# Patient Record
Sex: Female | Born: 1955 | Race: White | Hispanic: No | Marital: Married | Smoking: Never smoker
Health system: Southern US, Community
[De-identification: ages and names within clinical notes are randomized; demographics above are authoritative.]

## PROBLEM LIST (undated history)

## (undated) DIAGNOSIS — E119 Type 2 diabetes mellitus without complications: Secondary | ICD-10-CM

## (undated) HISTORY — PX: BREAST CYST EXCISION: SHX579

## (undated) HISTORY — PX: ABDOMINAL HYSTERECTOMY: SHX81

---

## 2014-11-12 DIAGNOSIS — E119 Type 2 diabetes mellitus without complications: Secondary | ICD-10-CM

## 2015-03-10 DIAGNOSIS — E782 Mixed hyperlipidemia: Secondary | ICD-10-CM | POA: Diagnosis present

## 2015-09-28 ENCOUNTER — Other Ambulatory Visit: Payer: Self-pay | Admitting: Family Medicine

## 2015-09-28 DIAGNOSIS — Z1231 Encounter for screening mammogram for malignant neoplasm of breast: Secondary | ICD-10-CM

## 2017-02-25 ENCOUNTER — Other Ambulatory Visit: Payer: Self-pay | Admitting: Family Medicine

## 2017-02-25 DIAGNOSIS — Z1239 Encounter for other screening for malignant neoplasm of breast: Secondary | ICD-10-CM

## 2017-03-06 ENCOUNTER — Ambulatory Visit
Admission: RE | Admit: 2017-03-06 | Discharge: 2017-03-06 | Disposition: A | Discharge status: Home / Self Care | Payer: BC Managed Care – PPO | Source: Ambulatory Visit | Attending: Family Medicine | Admitting: Family Medicine

## 2017-03-06 ENCOUNTER — Encounter: Payer: Self-pay | Admitting: Radiology

## 2017-03-06 DIAGNOSIS — Z1239 Encounter for other screening for malignant neoplasm of breast: Secondary | ICD-10-CM

## 2017-03-06 DIAGNOSIS — Z1231 Encounter for screening mammogram for malignant neoplasm of breast: Secondary | ICD-10-CM | POA: Insufficient documentation

## 2017-03-14 ENCOUNTER — Inpatient Hospital Stay
Admission: RE | Admit: 2017-03-14 | Discharge: 2017-03-14 | Disposition: A | Discharge status: Home / Self Care | Payer: Self-pay | Source: Ambulatory Visit | Attending: *Deleted | Admitting: *Deleted

## 2017-03-14 ENCOUNTER — Other Ambulatory Visit: Payer: Self-pay | Admitting: *Deleted

## 2017-03-14 DIAGNOSIS — Z9289 Personal history of other medical treatment: Secondary | ICD-10-CM

## 2018-03-05 ENCOUNTER — Other Ambulatory Visit: Payer: Self-pay

## 2018-03-05 ENCOUNTER — Encounter: Payer: Self-pay | Admitting: Emergency Medicine

## 2018-03-05 ENCOUNTER — Ambulatory Visit
Admission: EM | Admit: 2018-03-05 | Discharge: 2018-03-05 | Disposition: A | Discharge status: Home / Self Care | Payer: BC Managed Care – PPO | Attending: Family Medicine | Admitting: Family Medicine

## 2018-03-05 DIAGNOSIS — M5442 Lumbago with sciatica, left side: Secondary | ICD-10-CM | POA: Diagnosis not present

## 2018-03-05 DIAGNOSIS — T148XXA Other injury of unspecified body region, initial encounter: Secondary | ICD-10-CM

## 2018-03-05 DIAGNOSIS — S39012A Strain of muscle, fascia and tendon of lower back, initial encounter: Secondary | ICD-10-CM

## 2018-03-05 HISTORY — DX: Type 2 diabetes mellitus without complications: E11.9

## 2018-03-05 MED ORDER — MELOXICAM 7.5 MG PO TABS: 7.5000 mg | ORAL_TABLET | Freq: Every day | ORAL | 0 refills | Status: DC

## 2018-03-05 MED ORDER — CYCLOBENZAPRINE HCL 5 MG PO TABS: 5.0000 mg | ORAL_TABLET | Freq: Two times a day (BID) | ORAL | 0 refills | Status: DC | PRN

## 2018-03-05 NOTE — ED Triage Notes (Signed)
Pt here today c/o back pain. Started 4 days ago. She has been walking/exercising and had some back pain and radiated into her left buttock and down the left side of her leg. Took ibuprofen and was better. Was walking and doing things again. Then this morning she woke up and was worse. She has been doing stretches.

## 2018-03-05 NOTE — ED Provider Notes (Signed)
[facility] ____________________________________________  Time seen: Approximately 11:22 AM  I have reviewed the triage vital signs and the nursing notes.   HISTORY  Chief Complaint Back Pain   HPI Nancy Guerrero is a 62 y.o. female presenting with husband for evaluation of left lower back pain present for the last 1 week.  States initially she had a very mild twinge to her left lower back and she thought it was from walking more on different surfaces.  Worked outside pulling weeds and cutting weeds.  states then she had some pain to left lower back present after sleeping Friday night.  States Saturday her back was more sore in the left buttocks area causing her difficulty with sitting, bending and stepping down hard.  Patient reports the very mild twinge did improve and then she states that she does not like taking any medicine so she took very small doses dose of 1 or possible 2 doses of Advil intermittently which did help.  States on Monday and Tuesday she was feeling much better, and states yesterday she pretty proceeded to stretch her back more and states she feels like maybe now she overdid it.  States today she is having pain again with sitting as well as also having some pain in her left upper leg.  States pain is mostly with movement. Ice helps some.  States that she is sitting in a warm bath she has no pain.  Denies any numbness, loss of sensation, urinary bowel retention or incontinence, fevers, rash, fall or direct injury.  Reports that she does have known degenerative disc disease, denies any other past back problems.  Denies other aggravating or alleviating factors.  Reports otherwise feels well.  Denies chest pain, shortness of breath, abdominal pain, dysuria, extremity pain, extremity swelling or rash. Denies recent sickness. Denies recent antibiotic use.   Jaci Lazierrowder, Pete PeltJonathan Earl, MD: PCP   Past Medical History:  Diagnosis Date  . Diabetes mellitus without  complication (HCC)     There are no active problems to display for this patient.   Past Surgical History:  Procedure Laterality Date  . ABDOMINAL HYSTERECTOMY    . BREAST CYST EXCISION Right    neg     No current facility-administered medications for this encounter.   Current Outpatient Medications:  .  Cholecalciferol (VITAMIN D3) 400 units CAPS, Take 2 tablets by mouth 2 (two) times daily., Disp: , Rfl:  .  ibuprofen (ADVIL,MOTRIN) 200 MG tablet, Take by mouth., Disp: , Rfl:  .  magnesium oxide (MAG-OX) 400 MG tablet, Take by mouth., Disp: , Rfl:  .  vitamin B-12 (CYANOCOBALAMIN) 500 MCG tablet, Take by mouth., Disp: , Rfl:  .  cyclobenzaprine (FLEXERIL) 5 MG tablet, Take 1 tablet (5 mg total) by mouth 2 (two) times daily as needed for muscle spasms (do not drive, can cause drowsiness.)., Disp: 10 tablet, Rfl: 0 .  meloxicam (MOBIC) 7.5 MG tablet, Take 1 tablet (7.5 mg total) by mouth daily., Disp: 10 tablet, Rfl: 0  Allergies Latex; Azithromycin; Cefprozil; Clarithromycin; Iodinated diagnostic agents; Levofloxacin; Nitrofurantoin; and Penicillins  Family History  Problem Relation Age of Onset  . Heart failure Mother   . Rheum arthritis Father   . Breast cancer Neg Hx     Social History Social History   Tobacco Use  . Smoking status: Never Smoker  . Smokeless tobacco: Never Used  Substance Use Topics  . Alcohol use: Never    Frequency: Never  . Drug use: Never  Review of Systems Constitutional: No fever/chills. Cardiovascular: Denies chest pain. Respiratory: Denies shortness of breath. Gastrointestinal: No abdominal pain.  No nausea, no vomiting.  No diarrhea.  No constipation. Genitourinary: Negative for dysuria. Musculoskeletal: Negative for back pain. Skin: Negative for rash.   ____________________________________________   PHYSICAL EXAM:  VITAL SIGNS: ED Triage Vitals  Enc Vitals Group     BP 03/05/18 1007 (!) 152/83     Pulse Rate 03/05/18  1007 86     Resp 03/05/18 1007 18     Temp 03/05/18 1007 98.7 F (37.1 C)     Temp Source 03/05/18 1007 Oral     SpO2 03/05/18 1007 99 %     Weight 03/05/18 1007 164 lb (74.4 kg)     Height 03/05/18 1007 5' 6.5" (1.689 m)     Head Circumference --      Peak Flow --      Pain Score 03/05/18 1006 9     Pain Loc --      Pain Edu? --      Excl. in GC? --     Constitutional: Alert and oriented. Well appearing and in no acute distress. ENT      Head: Normocephalic and atraumatic. Cardiovascular: Normal rate, regular rhythm. Grossly normal heart sounds.  Good peripheral circulation. Respiratory: Normal respiratory effort without tachypnea nor retractions. Breath sounds are clear and equal bilaterally. No wheezes, rales, rhonchi. Gastrointestinal: Soft and nontender.  No CVA tenderness. Musculoskeletal: No midline cervical, thoracic or lumbar tenderness to palpation. Bilateral pedal pulses equal and easily palpated. Left lower sciatic notch and piriformis mild tenderness to direct palpation, mild tenderness along left IT band, no saddle anesthesia, left lower extremity otherwise nontender, no rash.  Mild pain with lumbar flexion, no pain with extension, mild pain with lumbar right and left rotation.  Mild pain with standing left knee left, no pain with right knee left.  No pain with plantarflexion or dorsiflexion.  Steady gait. Neurologic:  Normal speech and language. Speech is normal. No gait instability.  Skin:  Skin is warm, dry and intact. No rash noted. Psychiatric: Mood and affect are normal. Speech and behavior are normal. Patient exhibits appropriate insight and judgment   ___________________________________________   LABS (all labs ordered are listed, but only abnormal results are displayed)  Labs Reviewed - No data to display   PROCEDURES Procedures    INITIAL IMPRESSION / ASSESSMENT AND PLAN / ED COURSE  Pertinent labs & imaging results that were available during my care  of the patient were reviewed by me and considered in my medical decision making (see chart for details).  Well-appearing patient.  No acute distress.  No focal neurological deficit.  Suspect left-sided Low back strain with sciatica and muscle strain.  Encourage rest, ice, stretching, supportive care.  Patient states repetitively that she is very sensitive to medications.  Will treat with 7.5 mg daily Mobic.  As needed 5 mg Flexeril, in which patient states that she may try half a tablet first.  Discussed supportive care and avoidance of strenuous activity.Discussed indication, risks and benefits of medications with patient.   Discussed follow up with Primary care physician this week. Discussed follow up and return parameters including no resolution or any worsening concerns. Patient verbalized understanding and agreed to plan.   ____________________________________________   FINAL CLINICAL IMPRESSION(S) / ED DIAGNOSES  Final diagnoses:  Acute left-sided low back pain with left-sided sciatica  Muscle strain     ED Discharge Orders  Ordered    meloxicam (MOBIC) 7.5 MG tablet  Daily     03/05/18 1110    cyclobenzaprine (FLEXERIL) 5 MG tablet  2 times daily PRN     03/05/18 1110       Note: This dictation was prepared with Dragon dictation along with smaller phrase technology. Any transcriptional errors that result from this process are unintentional.         Renford Dills, NP 03/05/18 1129

## 2018-03-05 NOTE — Discharge Instructions (Signed)
Take medication as prescribed. Rest. Drink plenty of fluids.  ° °Follow up with your primary care physician this week as needed. Return to Urgent care for new or worsening concerns.  ° °

## 2018-08-14 ENCOUNTER — Other Ambulatory Visit: Payer: Self-pay | Admitting: Family Medicine

## 2018-08-14 DIAGNOSIS — Z1231 Encounter for screening mammogram for malignant neoplasm of breast: Secondary | ICD-10-CM

## 2018-09-01 ENCOUNTER — Ambulatory Visit
Admission: RE | Admit: 2018-09-01 | Discharge: 2018-09-01 | Disposition: A | Discharge status: Home / Self Care | Payer: BC Managed Care – PPO | Source: Ambulatory Visit | Attending: Family Medicine | Admitting: Family Medicine

## 2018-09-01 ENCOUNTER — Encounter (INDEPENDENT_AMBULATORY_CARE_PROVIDER_SITE_OTHER): Payer: Self-pay

## 2018-09-01 DIAGNOSIS — Z1231 Encounter for screening mammogram for malignant neoplasm of breast: Secondary | ICD-10-CM

## 2018-09-05 ENCOUNTER — Other Ambulatory Visit: Payer: Self-pay | Admitting: Family Medicine

## 2018-09-05 DIAGNOSIS — R921 Mammographic calcification found on diagnostic imaging of breast: Secondary | ICD-10-CM

## 2018-09-05 DIAGNOSIS — R928 Other abnormal and inconclusive findings on diagnostic imaging of breast: Secondary | ICD-10-CM

## 2018-09-10 ENCOUNTER — Ambulatory Visit
Admission: RE | Admit: 2018-09-10 | Discharge: 2018-09-10 | Disposition: A | Discharge status: Home / Self Care | Payer: BC Managed Care – PPO | Source: Ambulatory Visit | Attending: Family Medicine | Admitting: Family Medicine

## 2018-09-10 DIAGNOSIS — R928 Other abnormal and inconclusive findings on diagnostic imaging of breast: Secondary | ICD-10-CM | POA: Diagnosis present

## 2018-09-10 DIAGNOSIS — R921 Mammographic calcification found on diagnostic imaging of breast: Secondary | ICD-10-CM | POA: Diagnosis present

## 2020-05-05 ENCOUNTER — Other Ambulatory Visit: Payer: Self-pay

## 2020-05-05 ENCOUNTER — Ambulatory Visit
Admission: EM | Admit: 2020-05-05 | Discharge: 2020-05-05 | Disposition: A | Discharge status: Home / Self Care | Payer: Medicare PPO | Attending: Internal Medicine | Admitting: Internal Medicine

## 2020-05-05 ENCOUNTER — Encounter: Payer: Self-pay | Admitting: Internal Medicine

## 2020-05-05 DIAGNOSIS — R202 Paresthesia of skin: Secondary | ICD-10-CM | POA: Insufficient documentation

## 2020-05-05 DIAGNOSIS — T50Z95A Adverse effect of other vaccines and biological substances, initial encounter: Secondary | ICD-10-CM | POA: Diagnosis not present

## 2020-05-05 LAB — COMPREHENSIVE METABOLIC PANEL
ALT: 28 U/L (ref 0–44)
AST: 18 U/L (ref 15–41)
Albumin: 4.3 g/dL (ref 3.5–5.0)
Alkaline Phosphatase: 95 U/L (ref 38–126)
Anion gap: 10 (ref 5–15)
BUN: 24 mg/dL — ABNORMAL HIGH (ref 8–23)
CO2: 24 mmol/L (ref 22–32)
Calcium: 8.7 mg/dL — ABNORMAL LOW (ref 8.9–10.3)
Chloride: 103 mmol/L (ref 98–111)
Creatinine, Ser: 0.94 mg/dL (ref 0.44–1.00)
GFR calc Af Amer: >60 mL/min (ref >60)
GFR calc non Af Amer: >60 mL/min (ref >60)
Glucose, Bld: 132 mg/dL — ABNORMAL HIGH (ref 70–99)
Potassium: 3.8 mmol/L (ref 3.5–5.1)
Sodium: 137 mmol/L (ref 135–145)
Total Bilirubin: 0.6 mg/dL (ref 0.3–1.2)
Total Protein: 7.5 g/dL (ref 6.5–8.1)

## 2020-05-05 LAB — CBC WITH DIFFERENTIAL/PLATELET
Abs Immature Granulocytes: 0.03 10*3/uL (ref 0.00–0.07)
Basophils Absolute: 0 10*3/uL (ref 0.0–0.1)
Basophils Relative: 0 %
Eosinophils Absolute: 0.1 10*3/uL (ref 0.0–0.5)
Eosinophils Relative: 1 %
HCT: 46.6 % — ABNORMAL HIGH (ref 36.0–46.0)
Hemoglobin: 15.4 g/dL — ABNORMAL HIGH (ref 12.0–15.0)
Immature Granulocytes: 0 %
Lymphocytes Relative: 22 %
Lymphs Abs: 2.4 10*3/uL (ref 0.7–4.0)
MCH: 28.4 pg (ref 26.0–34.0)
MCHC: 33 g/dL (ref 30.0–36.0)
MCV: 86 fL (ref 80.0–100.0)
Monocytes Absolute: 0.6 10*3/uL (ref 0.1–1.0)
Monocytes Relative: 6 %
Neutro Abs: 7.4 10*3/uL (ref 1.7–7.7)
Neutrophils Relative %: 71 %
Platelets: 286 10*3/uL (ref 150–400)
RBC: 5.42 MIL/uL — ABNORMAL HIGH (ref 3.87–5.11)
RDW: 12 % (ref 11.5–15.5)
WBC: 10.5 10*3/uL (ref 4.0–10.5)
nRBC: 0 % (ref 0.0–0.2)

## 2020-05-05 LAB — MAGNESIUM: Magnesium: 2.2 mg/dL (ref 1.7–2.4)

## 2020-05-05 LAB — TSH: TSH: 0.823 u[IU]/mL (ref 0.350–4.500)

## 2020-05-05 LAB — FIBRIN DERIVATIVES D-DIMER (ARMC ONLY): Fibrin derivatives D-dimer (ARMC): 433.27 ng/mL (FEU) (ref 0.00–499.00)

## 2020-05-05 MED ORDER — HYDROXYZINE HCL 10 MG PO TABS: ORAL_TABLET | ORAL | 0 refills | Status: DC

## 2020-05-05 NOTE — Discharge Instructions (Addendum)
Your chemistry test shows slightly elevation of your sugar at 123, your calcium is slightly low, so consume more calcium, and you are a little dry. Drink more water.  The thyroid test, D-dimer( to check for clots) and B12 are not back yet. I will inform you when they are back.  I want you to try a medication called Atarax which we use of anxiety and allergic reactions/ itching. See how this helps you.  Your cell count shows slight elevation of your red cells, hemoglobin and hematocrit, this should be rechecked wit you doctor if it has been normal in the past.

## 2020-05-05 NOTE — ED Triage Notes (Signed)
Patient states she received her 1st COVID Pfizer vaccine on 09/01. She is having swollen lymph nodes in her neck, tingling and soreness in her body and elevated BP at night. She has been seen several times for these symptoms at [facility] and the ER. She contacted her PCP today and they advised her to come to UC.

## 2020-05-05 NOTE — ED Provider Notes (Signed)
[facility]    CSN: [id number] Arrival date & time: 05/05/20  1138      History   Chief Complaint Chief Complaint  Patient presents with  . immunization reaction    HPI Nancy Guerrero is a 64 y.o. female who presents with worse tingling all over her body  And twitching since Covid injection # 1 this month.  15 min right after she got the shot she felt tingling across her neck which has no resolved. She just felt swollen glands, and worse on the R.  She also  developed elevated blood pressure in the pm's and been to ER x 1, and x 1 at the urgent care. The urgent care provider told her she was just anxious, but pt denies being anxious at all. Has been reading her Bible and praying when she is awaken with the tingling or twitching.  She took Advil cold 2 days ago and helped her glands reduce by 50% Has been taking Vit C advised by her PCP.  The tingling and twitching gets better if she wears something tight on her body. Denies fatigue, and is exercising and drinking well.  She had similar symptoms when she had propophol reaction 15 years ago and took 3 weeks for this to resolve.  She denies CP and SOB.  Her glands are 50% better.  Has taken Benadryl 1/2 tsp qhs, which puts her to sleep til around 3 am, but cant tell it helps with the tingling or twitching. Only wakes up in the am with sore muscles.  She has had occasional chest pains across her chest which is provoked with palpation but has resolved and had normal EKG when she went to the ER. She has not had labs done when at the ER, and only had HGBA1C this month. BMP was in April, and no other labs besides lipids then.  She is on estrogen cream, but uses smaller doses than told twice a week. Has been on this x 2 months ago Takes centrum, vit D 2000 IU per day, magneisum 400 mg qd    Past Medical History:  Diagnosis Date  . Diabetes mellitus without complication (HCC)     There are no problems to display for this  patient.   Past Surgical History:  Procedure Laterality Date  . ABDOMINAL HYSTERECTOMY    . BREAST CYST EXCISION Right    neg    OB History   No obstetric history on file.      Home Medications    Prior to Admission medications   Medication Sig Start Date End Date Taking? Authorizing Provider  Cholecalciferol (VITAMIN D3) 400 units CAPS Take 2 tablets by mouth 2 (two) times daily.   Yes [provider]  ibuprofen (ADVIL,MOTRIN) 200 MG tablet Take by mouth.   Yes [provider]  magnesium oxide (MAG-OX) 400 MG tablet Take by mouth.   Yes [provider]  vitamin B-12 (CYANOCOBALAMIN) 500 MCG tablet Take by mouth.   Yes [provider]  hydrOXYzine (ATARAX/VISTARIL) 10 MG tablet 1-2 every 8 hours as needed for anxiety and itching. 05/05/20   Rodriguez-Southworth, Nettie Elm, PA-C    Family History Family History  Problem Relation Age of Onset  . Heart failure Mother   . Rheum arthritis Father   . Breast cancer Neg Hx     Social History Social History   Tobacco Use  . Smoking status: Never Smoker  . Smokeless tobacco: Never Used  Vaping Use  . Vaping  Use: Never used  Substance Use Topics  . Alcohol use: Never  . Drug use: Never     Allergies   Latex, Azithromycin, Cefprozil, Clarithromycin, Iodinated diagnostic agents, Levofloxacin, Nitrofurantoin, Propofol, and Penicillins   Review of Systems Review of Systems  Constitutional: Positive for appetite change. Negative for fever.       Has been making herself eat. Has been gaining weight  HENT: Negative for congestion.   Respiratory: Negative for cough, chest tightness and shortness of breath.   Cardiovascular: Negative for palpitations and leg swelling.       Has had intermittent chest soreness across her chest off and on and had nl EKG when she went to ER for this  Gastrointestinal: Positive for abdominal pain. Negative for constipation, diarrhea, nausea and vomiting.        Has generalized abdominal pain which is worse since after the Covid injection  Genitourinary: Negative for difficulty urinating.  Musculoskeletal: Negative for arthralgias, back pain, gait problem, joint swelling, myalgias, neck pain and neck stiffness.  Skin: Negative for rash.       Denies skin itching when exposed to cold water that she paid attention, but gets itching on her skin off and on  Neurological: Negative for dizziness, tremors, weakness, numbness and headaches.       + twitches   Psychiatric/Behavioral: Positive for sleep disturbance. Negative for agitation, behavioral problems and confusion. The patient is not nervous/anxious.      Physical Exam Triage Vital Signs ED Triage Vitals  Enc Vitals Group     BP 05/05/20 1215 (!) 155/88     Pulse Rate 05/05/20 1215 86     Resp 05/05/20 1215 18     Temp 05/05/20 1215 99.1 F (37.3 C)     Temp Source 05/05/20 1215 Oral     SpO2 05/05/20 1215 100 %     Weight 05/05/20 1212 156 lb (70.8 kg)     Height 05/05/20 1212 5\' 6"  (1.676 m)     Head Circumference --      Peak Flow --      Pain Score 05/05/20 1211 4     Pain Loc --      Pain Edu? --      Excl. in GC? --    No data found.  Updated Vital Signs BP (!) 155/88 (BP Location: Right Arm)   Pulse 86   Temp 99.1 F (37.3 C) (Oral)   Resp 18   Ht 5\' 6"  (1.676 m)   Wt 156 lb (70.8 kg)   SpO2 100%   BMI 25.18 kg/m   Visual Acuity Right Eye Distance:   Left Eye Distance:   Bilateral Distance:    Right Eye Near:   Left Eye Near:    Bilateral Near:     Physical Exam Physical Exam Vitals signs and nursing note reviewed.  Constitutional:      General: SHe is not in acute distress.    Appearance: He is well-developed and normal weight. SHe is not ill-appearing, toxic-appearing or diaphoretic.  HENT:     Head: Normocephalic.  Eyes:     Extraocular Movements: Extraocular movements intact.     Pupils: Pupils are equal, round, and reactive to light.  Neck:      Musculoskeletal: Neck supple. No neck rigidity.     Meningeal: Brudzinski's sign absent.  Cardiovascular:     Rate and Rhythm: Normal rate and regular rhythm.     Heart sounds: No murmur.  Pulmonary:  Effort: Pulmonary effort is normal.     Breath sounds: Normal breath sounds. No wheezing, rhonchi or rales.  Abdominal:     General: Bowel sounds are normal.     Palpations: Abdomen is soft. There is no mass.     Has generalized tenderness. There is no guarding.  Musculoskeletal: Normal range of motion. No soft tissue tenderness noted.  Lymphadenopathy:     Cervical: No cervical adenopathy.  Skin:    General: Skin is warm and dry.  Neurological:     Mental Status: She is alert.     Cranial Nerves: No cranial nerve deficit or facial asymmetry.     Sensory: No sensory deficit.     Motor: No weakness.     Coordination: Romberg sign negative. Coordination normal.     Gait: Gait normal.     Deep Tendon Reflexes: Reflexes are +3/4 bilaterally on upper and lower extremities.     Comments: Normal Romberg, finger to nose, tandem gait.  She did not have any twitching while I was in the room with her.  Psychiatric:        Mood and Affect: Mood normal.        Speech: Speech normal.        Behavior: Behavior normal.     UC Treatments / Results  Labs (all labs ordered are listed, but only abnormal results are displayed) Labs Reviewed  CBC WITH DIFFERENTIAL/PLATELET - Abnormal; Notable for the following components:      Result Value   RBC 5.42 (*)    Hemoglobin 15.4 (*)    HCT 46.6 (*)    All other components within normal limits  COMPREHENSIVE METABOLIC PANEL - Abnormal; Notable for the following components:   Glucose, Bld 132 (*)    BUN 24 (*)    Calcium 8.7 (*)    All other components within normal limits  FIBRIN DERIVATIVES D-DIMER ([facility] ONLY)  MAGNESIUM  VITAMIN B12  TSH    EKG   Radiology No results found.  Procedures Procedures (including critical care  time)  Medications Ordered in UC Medications - No data to display  Initial Impression / Assessment and Plan / UC Course  I have reviewed the triage vital signs and the nursing notes. I reviewed her CBC and CMP with her and since she could not find a CBC for me to compare it to, she was recommended to hydrate well and have it repeated in a few weeks.  Her D-dimer is normal.  I will have her try Atarax to see if this helps her tingling resolve, if she is anxious it may help, but if no change, then it is not anxiety.  Pertinent labs & imaging results that were available during my care of the patient were reviewed by me and considered in my medical decision making (see chart for details). I will inform her when the rest of the labs come in.  Clinical Course as of May 05 2024  Thu May 05, 2020  1337 RDW: 12.0 [SR]    Clinical Course User Index [SR] Rodriguez-Southworth, Nettie Elm, [place]    Final Clinical Impressions(s) / UC Diagnoses   Final diagnoses:  Adverse effect of vaccine, initial encounter     Discharge Instructions     Your chemistry test shows slightly elevation of your sugar at 123, your calcium is slightly low, so consume more calcium, and you are a little dry. Drink more water.  The thyroid test, D-dimer( to check for clots) and B12  are not back yet. I will inform you when they are back.  I want you to try a medication called Atarax which we use of anxiety and allergic reactions/ itching. See how this helps you.  Your cell count shows slight elevation of your red cells, hemoglobin and hematocrit, this should be rechecked wit you doctor if it has been normal in the past.     ED Prescriptions    Medication Sig Dispense Auth. Provider   hydrOXYzine (ATARAX/VISTARIL) 10 MG tablet 1-2 every 8 hours as needed for anxiety and itching. 30 tablet Rodriguez-Southworth, Nettie Elm, PA-C     PDMP not reviewed this encounter.   Garey Ham, Cordelia Poche 05/05/20 2026

## 2020-05-06 LAB — VITAMIN B12: Vitamin B-12: 299 pg/mL (ref 180–914)

## 2020-06-14 ENCOUNTER — Other Ambulatory Visit: Payer: Self-pay | Admitting: Cardiology

## 2020-06-14 ENCOUNTER — Other Ambulatory Visit: Payer: Self-pay

## 2020-06-14 DIAGNOSIS — R0602 Shortness of breath: Secondary | ICD-10-CM

## 2020-06-24 ENCOUNTER — Other Ambulatory Visit: Payer: Self-pay

## 2020-06-24 ENCOUNTER — Ambulatory Visit (INDEPENDENT_AMBULATORY_CARE_PROVIDER_SITE_OTHER)
Admission: RE | Admit: 2020-06-24 | Discharge: 2020-06-24 | Disposition: A | Discharge status: Home / Self Care | Payer: Self-pay | Source: Ambulatory Visit | Attending: Cardiology | Admitting: Cardiology

## 2020-06-24 DIAGNOSIS — R0602 Shortness of breath: Secondary | ICD-10-CM

## 2021-03-22 DIAGNOSIS — I10 Essential (primary) hypertension: Secondary | ICD-10-CM | POA: Diagnosis present

## 2021-03-24 ENCOUNTER — Emergency Department: Payer: Medicare PPO

## 2021-03-24 ENCOUNTER — Observation Stay: Payer: Medicare PPO

## 2021-03-24 ENCOUNTER — Other Ambulatory Visit: Payer: Self-pay

## 2021-03-24 ENCOUNTER — Observation Stay
Admission: EM | Admit: 2021-03-24 | Discharge: 2021-03-24 | Disposition: A | Discharge status: Home / Self Care | Payer: Medicare PPO | Attending: Internal Medicine | Admitting: Internal Medicine

## 2021-03-24 DIAGNOSIS — I10 Essential (primary) hypertension: Secondary | ICD-10-CM | POA: Insufficient documentation

## 2021-03-24 DIAGNOSIS — Z91040 Latex allergy status: Secondary | ICD-10-CM | POA: Diagnosis not present

## 2021-03-24 DIAGNOSIS — R079 Chest pain, unspecified: Principal | ICD-10-CM | POA: Insufficient documentation

## 2021-03-24 DIAGNOSIS — Z7984 Long term (current) use of oral hypoglycemic drugs: Secondary | ICD-10-CM | POA: Insufficient documentation

## 2021-03-24 DIAGNOSIS — E119 Type 2 diabetes mellitus without complications: Secondary | ICD-10-CM | POA: Diagnosis not present

## 2021-03-24 DIAGNOSIS — R778 Other specified abnormalities of plasma proteins: Secondary | ICD-10-CM | POA: Insufficient documentation

## 2021-03-24 DIAGNOSIS — E782 Mixed hyperlipidemia: Secondary | ICD-10-CM

## 2021-03-24 DIAGNOSIS — R000 Tachycardia, unspecified: Secondary | ICD-10-CM | POA: Diagnosis not present

## 2021-03-24 DIAGNOSIS — M79661 Pain in right lower leg: Secondary | ICD-10-CM | POA: Insufficient documentation

## 2021-03-24 DIAGNOSIS — R072 Precordial pain: Secondary | ICD-10-CM | POA: Diagnosis not present

## 2021-03-24 DIAGNOSIS — R0989 Other specified symptoms and signs involving the circulatory and respiratory systems: Secondary | ICD-10-CM

## 2021-03-24 DIAGNOSIS — Z7952 Long term (current) use of systemic steroids: Secondary | ICD-10-CM | POA: Insufficient documentation

## 2021-03-24 LAB — CBC
HCT: 47.8 % — ABNORMAL HIGH (ref 36.0–46.0)
Hemoglobin: 16.6 g/dL — ABNORMAL HIGH (ref 12.0–15.0)
MCH: 29.8 pg (ref 26.0–34.0)
MCHC: 34.7 g/dL (ref 30.0–36.0)
MCV: 85.8 fL (ref 80.0–100.0)
Platelets: 335 10*3/uL (ref 150–400)
RBC: 5.57 MIL/uL — ABNORMAL HIGH (ref 3.87–5.11)
RDW: 11.9 % (ref 11.5–15.5)
WBC: 16.1 10*3/uL — ABNORMAL HIGH (ref 4.0–10.5)
nRBC: 0 % (ref 0.0–0.2)

## 2021-03-24 LAB — PROTIME-INR
INR: 0.9 (ref 0.8–1.2)
Prothrombin Time: 12.5 seconds (ref 11.4–15.2)

## 2021-03-24 LAB — TROPONIN I (HIGH SENSITIVITY)
Troponin I (High Sensitivity): 29 ng/L — ABNORMAL HIGH (ref <18)
Troponin I (High Sensitivity): 29 ng/L — ABNORMAL HIGH (ref <18)

## 2021-03-24 LAB — BASIC METABOLIC PANEL
Anion gap: 13 (ref 5–15)
BUN: 37 mg/dL — ABNORMAL HIGH (ref 8–23)
CO2: 20 mmol/L — ABNORMAL LOW (ref 22–32)
Calcium: 9.4 mg/dL (ref 8.9–10.3)
Chloride: 101 mmol/L (ref 98–111)
Creatinine, Ser: 1.07 mg/dL — ABNORMAL HIGH (ref 0.44–1.00)
GFR, Estimated: 57 mL/min — ABNORMAL LOW (ref >60)
Glucose, Bld: 196 mg/dL — ABNORMAL HIGH (ref 70–99)
Potassium: 3.9 mmol/L (ref 3.5–5.1)
Sodium: 134 mmol/L — ABNORMAL LOW (ref 135–145)

## 2021-03-24 MED ORDER — AMLODIPINE BESYLATE 5 MG PO TABS: 5.0000 mg | ORAL_TABLET | Freq: Two times a day (BID) | ORAL | 0 refills | Status: DC | PRN

## 2021-03-24 MED ORDER — ONDANSETRON HCL 4 MG/2ML IJ SOLN
4.0000 mg | Freq: Once | INTRAMUSCULAR | Status: AC
  Administered 2021-03-24: 4 mg via INTRAVENOUS
  Filled 2021-03-24: qty 2

## 2021-03-24 MED ORDER — MORPHINE SULFATE (PF) 2 MG/ML IV SOLN
2.0000 mg | Freq: Once | INTRAVENOUS | Status: DC
  Filled 2021-03-24: qty 1

## 2021-03-24 MED ORDER — AMLODIPINE BESYLATE 5 MG PO TABS: 5.0000 mg | ORAL_TABLET | Freq: Two times a day (BID) | ORAL | Status: DC | PRN

## 2021-03-24 MED ORDER — ALUM & MAG HYDROXIDE-SIMETH 200-200-20 MG/5ML PO SUSP
30.0000 mL | Freq: Once | ORAL | Status: AC
  Administered 2021-03-24: 30 mL via ORAL
  Filled 2021-03-24: qty 30

## 2021-03-24 MED ORDER — ASPIRIN 81 MG PO CHEW
324.0000 mg | CHEWABLE_TABLET | Freq: Once | ORAL | Status: AC
  Administered 2021-03-24: 324 mg via ORAL
  Filled 2021-03-24: qty 4

## 2021-03-24 NOTE — Consult Note (Signed)
CARDIOLOGY CONSULT NOTE               Patient ID: Nancy Guerrero MRN: [id number] DOB/AGE: 1955-09-20 65 y.o.  Admit date: 03/24/2021 Referring Physician Dr. Carollee Herter hospitalist Primary Physician Dr. Nemiah Commander primary Primary Cardiologist  Reason for Consultation chest pain  HPI: Patient is a 65 year old female with multiple risk factors hypertension diabetes hyperlipidemia recently complaining of chest pain had a right frank ankle fracture back in May she had some chest pain was seen in [facility] and subsequently was referred for further evaluation.  Patient states chest pain is resolved palpitations resolved  Review of systems complete and found to be negative unless listed above     Past Medical History:  Diagnosis Date   Diabetes mellitus without complication (HCC)     Past Surgical History:  Procedure Laterality Date   ABDOMINAL HYSTERECTOMY     BREAST CYST EXCISION Right    neg    (Not in a hospital admission)  Social History   Socioeconomic History   Marital status: Married    Spouse name: Not on file   Number of children: Not on file   Years of education: Not on file   Highest education level: Not on file  Occupational History   Not on file  Tobacco Use   Smoking status: Never   Smokeless tobacco: Never  Vaping Use   Vaping Use: Never used  Substance and Sexual Activity   Alcohol use: Never   Drug use: Never   Sexual activity: Not on file  Other Topics Concern   Not on file  Social History Narrative   Not on file   Social Determinants of Health   Financial Resource Strain: Not on file  Food Insecurity: Not on file  Transportation Needs: Not on file  Physical Activity: Not on file  Stress: Not on file  Social Connections: Not on file  Intimate Partner Violence: Not on file    Family History  Problem Relation Age of Onset   Heart failure Mother    Rheum arthritis Father    Breast cancer Neg Hx       Review of systems complete and  found to be negative unless listed above      PHYSICAL EXAM  General: Well developed, well nourished, in no acute distress HEENT:  Normocephalic and atramatic Neck:  No JVD.  Lungs: Clear bilaterally to auscultation and percussion. Heart: HRRR . Normal S1 and S2 without gallops or murmurs.  Abdomen: Bowel sounds are positive, abdomen soft and non-tender  Msk:  Back normal, normal gait. Normal strength and tone for age. Extremities: No clubbing, cyanosis or edema.   Neuro: Alert and oriented X 3. Psych:  Good affect, responds appropriately  Labs:   Lab Results  Component Value Date   WBC 16.1 (H) 03/24/2021   HGB 16.6 (H) 03/24/2021   HCT 47.8 (H) 03/24/2021   MCV 85.8 03/24/2021   PLT 335 03/24/2021    Recent Labs  Lab 03/24/21 0059  NA 134*  K 3.9  CL 101  CO2 20*  BUN 37*  CREATININE 1.07*  CALCIUM 9.4  GLUCOSE 196*   No results found for: CKTOTAL, CKMB, CKMBINDEX, TROPONINI No results found for: CHOL No results found for: HDL No results found for: LDLCALC No results found for: TRIG No results found for: CHOLHDL No results found for: LDLDIRECT    Radiology: DG Chest 2 View  Result Date: 03/24/2021 CLINICAL DATA:  Chest pain EXAM: CHEST - 2  VIEW COMPARISON:  None. FINDINGS: Scarring in the lingula. Right lung clear. Heart is normal size. No effusions or acute bony abnormality. IMPRESSION: No active cardiopulmonary disease. Electronically Signed   By: Charlett Nose M.D.   On: 03/24/2021 01:34   US Venous Img Lower Unilateral Right (DVT)  Result Date: 03/24/2021 CLINICAL DATA:  Right calf pain. EXAM: RIGHT LOWER EXTREMITY VENOUS DOPPLER ULTRASOUND TECHNIQUE: Gray-scale sonography with graded compression, as well as color Doppler and duplex ultrasound were performed to evaluate the lower extremity deep venous systems from the level of the common femoral vein and including the common femoral, femoral, profunda femoral, popliteal and calf veins including the posterior  tibial, peroneal and gastrocnemius veins when visible. The superficial great saphenous vein was also interrogated. Spectral Doppler was utilized to evaluate flow at rest and with distal augmentation maneuvers in the common femoral, femoral and popliteal veins. COMPARISON:  None. FINDINGS: Contralateral Common Femoral Vein: Respiratory phasicity is normal and symmetric with the symptomatic side. No evidence of thrombus. Normal compressibility. Common Femoral Vein: No evidence of thrombus. Normal compressibility, respiratory phasicity and response to augmentation. Saphenofemoral Junction: No evidence of thrombus. Normal compressibility and flow on color Doppler imaging. Profunda Femoral Vein: No evidence of thrombus. Normal compressibility and flow on color Doppler imaging. Femoral Vein: No evidence of thrombus. Normal compressibility, respiratory phasicity and response to augmentation. Popliteal Vein: No evidence of thrombus. Normal compressibility, respiratory phasicity and response to augmentation. Calf Veins: No evidence of thrombus. Normal compressibility and flow on color Doppler imaging. Superficial Great Saphenous Vein: No evidence of thrombus. Normal compressibility. Venous Reflux:  None. Other Findings: No evidence of superficial thrombophlebitis or abnormal fluid collection. IMPRESSION: No evidence of right lower extremity deep vein thrombosis. Electronically Signed   By: Irish Lack M.D.   On: 03/24/2021 07:50    EKG: Normal sinus rhythm nonspecific ST-T wave changes  ASSESSMENT AND PLAN:  Chest pain Hypertension Hyperlipidemia Diabetes  . Plan Agree rule out myocardial infarction Follow-up EKGs and troponins Consider functional study possibly can be done as an outpatient Maintain diabetes management and control Continue statin therapy for hyperlipidemia Consider early discharge with outpatient functional study  Signed: Alwyn Pea MD 03/24/2021, 1:16 PM

## 2021-03-24 NOTE — Assessment & Plan Note (Signed)
Patient was discharged to home on Crestor from [facility].  She states that she has not started this.

## 2021-03-24 NOTE — Discharge Summary (Signed)
Physician Discharge Summary  Nancy Guerrero FAO:[id number] DOB: Jan 22, 1956 DOA: 03/24/2021  PCP: Enid Baas, MD  Admit date: 03/24/2021 Discharge date: 03/24/2021  Admitted From: Home Disposition:  Home Recommendations for Outpatient Follow-up:  Follow up with cardiology Dr. Gwen Pounds next week. Norvasc 5 mg bid prn SBP>160 per cardiology  Home Health:No Equipment/Devices:none  Discharge Condition:stable CODE STATUS:FULL  Diet recommendation: Heart Healthy / Carb Modified / Regular / Dysphagia   Brief/Interim Summary: You may copy/paste interim summary or write brief hospital course depending on length of stay  Discharge Diagnoses:  Principal Problem:   Chest pain Active Problems:   Hypertension   Mixed hyperlipidemia   Type 2 diabetes mellitus without complication, without long-term current use of insulin (HCC)    Discharge Instructions  Discharge Instructions     Diet - low sodium heart healthy   Complete by: As directed    Discharge instructions   Complete by: As directed    Call Dr. Gwen Pounds this Monday for followup appointment   Increase activity slowly   Complete by: As directed        Allergies as of 03/24/2021          Allergies  Allergen Reactions   Latex Other (See Comments) and Shortness Of Breath    Delayed healing    Aspirin    Azithromycin Other (See Comments)    Hand numbness   Cefprozil Other (See Comments)    Shaking Shaking    Clarithromycin Other (See Comments)    CNS reaction Shaking    Iodinated Diagnostic Agents Hives   Levofloxacin Other (See Comments)    Shaking   Moxifloxacin    Nitrofurantoin Other (See Comments)    Shaking   Propofol    Sulfa Antibiotics    Penicillins Hives and Rash    Consultations: Cardiology - Dr. Juliann Pares.   Procedures/Studies: DG Chest 2 View  Result Date: 03/24/2021 CLINICAL DATA:  Chest pain EXAM: CHEST - 2 VIEW COMPARISON:  None. FINDINGS: Scarring in the lingula. Right  lung clear. Heart is normal size. No effusions or acute bony abnormality. IMPRESSION: No active cardiopulmonary disease. Electronically Signed   By: Charlett Nose M.D.   On: 03/24/2021 01:34   US Venous Img Lower Unilateral Right (DVT)  Result Date: 03/24/2021 CLINICAL DATA:  Right calf pain. EXAM: RIGHT LOWER EXTREMITY VENOUS DOPPLER ULTRASOUND TECHNIQUE: Gray-scale sonography with graded compression, as well as color Doppler and duplex ultrasound were performed to evaluate the lower extremity deep venous systems from the level of the common femoral vein and including the common femoral, femoral, profunda femoral, popliteal and calf veins including the posterior tibial, peroneal and gastrocnemius veins when visible. The superficial great saphenous vein was also interrogated. Spectral Doppler was utilized to evaluate flow at rest and with distal augmentation maneuvers in the common femoral, femoral and popliteal veins. COMPARISON:  None. FINDINGS: Contralateral Common Femoral Vein: Respiratory phasicity is normal and symmetric with the symptomatic side. No evidence of thrombus. Normal compressibility. Common Femoral Vein: No evidence of thrombus. Normal compressibility, respiratory phasicity and response to augmentation. Saphenofemoral Junction: No evidence of thrombus. Normal compressibility and flow on color Doppler imaging. Profunda Femoral Vein: No evidence of thrombus. Normal compressibility and flow on color Doppler imaging. Femoral Vein: No evidence of thrombus. Normal compressibility, respiratory phasicity and response to augmentation. Popliteal Vein: No evidence of thrombus. Normal compressibility, respiratory phasicity and response to augmentation. Calf Veins: No evidence of thrombus. Normal compressibility and flow on color Doppler imaging. Superficial Great Saphenous  Vein: No evidence of thrombus. Normal compressibility. Venous Reflux:  None. Other Findings: No evidence of superficial thrombophlebitis  or abnormal fluid collection. IMPRESSION: No evidence of right lower extremity deep vein thrombosis. Electronically Signed   By: Irish Lack M.D.   On: 03/24/2021 07:50   (Echo, Carotid, EGD, Colonoscopy, ERCP)    Hospital Course: Chest pain - pt seen in the ER. Pt had mildly elevated troponin. Pt hesitant about getting stress test. Pt cannot get walking treadmill echo due to right ankle fracture. Pt requested cardiology consult. Pt seen in ER by Dr. Juliann Pares who felt that pt did not need in-hospital stress testing(see his progress note). That he would arrange for outpatient cardiac stress testing. Pt had one dose of mylanta that helped with her indigestion. Cardiology instructed pt to have prn norvasc 5 mg bid prn SBP>160.  Instructions discussed with pt and her dtr at the ER bedside. Pt discharged to home in stable medical condition. Pt to f/u with Dr. Gwen Pounds next week. Pt to call for appointment.   Discharge Exam: Vitals:   03/24/21 1000 03/24/21 1300  BP: 124/74 123/70  Pulse: 94 78  Resp: 18 16  Temp:    SpO2: 99% 94%   Vitals:   03/24/21 0730 03/24/21 0800 03/24/21 1000 03/24/21 1300  BP: 124/72 122/68 124/74 123/70  Pulse: 82 83 94 78  Resp: 20 19 18 16   Temp:      TempSrc:      SpO2: 96% 94% 99% 94%  Weight:      Height:          The results of significant diagnostics from this hospitalization (including imaging, microbiology, ancillary and laboratory) are listed below for reference.     Microbiology: No results found for this or any previous visit (from the past 240 hour(s)).   Labs: BNP (last 3 results) No results for input(s): BNP in the last 8760 hours. Basic Metabolic Panel: Recent Labs  Lab 03/24/21 0059  NA 134*  K 3.9  CL 101  CO2 20*  GLUCOSE 196*  BUN 37*  CREATININE 1.07*  CALCIUM 9.4   Liver Function Tests: No results for input(s): AST, ALT, ALKPHOS, BILITOT, PROT, ALBUMIN in the last 168 hours. No results for input(s): LIPASE, AMYLASE  in the last 168 hours. No results for input(s): AMMONIA in the last 168 hours. CBC: Recent Labs  Lab 03/24/21 0059  WBC 16.1*  HGB 16.6*  HCT 47.8*  MCV 85.8  PLT 335   Cardiac Enzymes: No results for input(s): CKTOTAL, CKMB, CKMBINDEX, TROPONINI in the last 168 hours. BNP: Invalid input(s): POCBNP CBG: No results for input(s): GLUCAP in the last 168 hours. D-Dimer No results for input(s): DDIMER in the last 72 hours. Hgb A1c No results for input(s): HGBA1C in the last 72 hours. Lipid Profile No results for input(s): CHOL, HDL, LDLCALC, TRIG, CHOLHDL, LDLDIRECT in the last 72 hours. Thyroid function studies No results for input(s): TSH, T4TOTAL, T3FREE, THYROIDAB in the last 72 hours.  Invalid input(s): FREET3 Anemia work up No results for input(s): VITAMINB12, FOLATE, FERRITIN, TIBC, IRON, RETICCTPCT in the last 72 hours. Urinalysis No results found for: COLORURINE, APPEARANCEUR, LABSPEC, PHURINE, GLUCOSEU, HGBUR, BILIRUBINUR, KETONESUR, PROTEINUR, UROBILINOGEN, NITRITE, LEUKOCYTESUR Sepsis Labs Invalid input(s): PROCALCITONIN,  WBC,  LACTICIDVEN Microbiology No results found for this or any previous visit (from the past 240 hour(s)).   Time coordinating discharge: Over 30 minutes  SIGNED:   05/24/21, DO  [facility] 03/24/2021, 2:48 PM Pager  If 7PM-7AM, please contact night-coverage [email or web address]

## 2021-03-24 NOTE — ED Triage Notes (Signed)
C/o persistent, intermittent high blood pressure, and tachycardia. Pt. Was seen at [facility] yesterday for same. Pt. States BP was 245/109 with HR of 145 at home. Pt. States she had elevated trop, and d-dimer at [facility] yesterday.

## 2021-03-24 NOTE — Progress Notes (Signed)
IV removed from patient. Discharge instructions given to patient. Verbalized understanding. No acute distress at this time. Family transporting patient home.

## 2021-03-24 NOTE — Assessment & Plan Note (Signed)
Admit to telemetry.  Serial troponins.  Patient requesting a cardiology consultation.  I have contacted Dr. Juliann Pares with [facility].  Patient understands that she would not be seen by cardiology until much later in the afternoon after they have finished with any invasive procedures and with clinic.  We will make her n.p.o. in the morning in case she agrees to to proceed with stress testing.  She will likely need Lexiscan testing as the patient cannot walk on a treadmill.  She has multiple medication allergies.  She has a severe IV contrast allergy requires IV steroids and IV Benadryl as premedications.

## 2021-03-24 NOTE — ED Notes (Signed)
Ultrasound in with patient

## 2021-03-24 NOTE — Subjective & Objective (Signed)
CC: recurrent chest pain HPI: 65 year old white female with a history of hypertension, type 2 diabetes without long-term insulin, hyperlipidemia, recent right ankle fracture in May 2022 who presents to the ER with recurrent chest pain.  Patient was initially seen at [facility] on 03/22/2021 for chest pain.  She was sent to [facility] for cardiology consultation.  She had initial troponin I of 149.  Patient had an echocardiogram that was normal.  Patient was noted to be hypertensive according to her.  She did not have a stress test.  She was discharged from the hospital yesterday.  This morning, she started developing chest pain again.  This associate with indigestion.  She was short of breath.  She developed chest pressure.  She states that she is never had this before.  2 days ago when she had chest pain, this was associated with hypertension however today she did not have any hypertension.  She checked her blood pressure and it was normal.  Patient states that she never has indigestion.  Troponin I today was 29.  Repeat troponin was 29.  Patient states she is no longer having chest pain but still having quite a bit of indigestion which she states is unusual for her.  Patient is quite hesitant about having any sort of cardiac stress test performed.  She is requesting a cardiology consultation.  She is a patient of Dr. Gwen Pounds.  Patient had a CT coronary calcium score of 7 in November 2021.  Patient cannot walk on a treadmill due to recent broken right ankle.

## 2021-03-24 NOTE — H&P (Signed)
History and Physical    Nancy Guerrero MVH:[id number] DOB: Mar 08, 1956 DOA: 03/24/2021  PCP: Enid Baas, MD   Patient coming from: Home  I have personally briefly reviewed patient's old medical records in [facility]  CC: recurrent chest pain HPI: 65 year old white female with a history of hypertension, type 2 diabetes without long-term insulin, hyperlipidemia, recent right ankle fracture in May 2022 who presents to the ER with recurrent chest pain.  Patient was initially seen at [facility] on 03/22/2021 for chest pain.  She was sent to [facility] for cardiology consultation.  She had initial troponin I of 149.  Patient had an echocardiogram that was normal.  Patient was noted to be hypertensive according to her.  She did not have a stress test.  She was discharged from the hospital yesterday.  This morning, she started developing chest pain again.  This associate with indigestion.  She was short of breath.  She developed chest pressure.  She states that she is never had this before.  2 days ago when she had chest pain, this was associated with hypertension however today she did not have any hypertension.  She checked her blood pressure and it was normal.  Patient states that she never has indigestion.  Troponin I today was 29.  Repeat troponin was 29.  Patient states she is no longer having chest pain but still having quite a bit of indigestion which she states is unusual for her.  Patient is quite hesitant about having any sort of cardiac stress test performed.  She is requesting a cardiology consultation.  She is a patient of Dr. Gwen Pounds.  Patient had a CT coronary calcium score of 7 in November 2021.  Patient cannot walk on a treadmill due to recent broken right ankle.   ED Course: pt seen in ER. Given ASA. LE U/S negative for DVT. Troponin I of 29 x 2 sets.  Review of Systems:  Review of Systems  Constitutional: Negative.   HENT: Negative.    Eyes: Negative.    Respiratory:  Positive for shortness of breath.   Cardiovascular:  Positive for chest pain.  Gastrointestinal:  Positive for heartburn and nausea.  Genitourinary: Negative.   Musculoskeletal:  Positive for joint pain.       Right ankle and right leg pain.  Skin: Negative.   Neurological: Negative.   Endo/Heme/Allergies: Negative.   Psychiatric/Behavioral: Negative.     Past Medical History:  Diagnosis Date   Diabetes mellitus without complication (HCC)     Past Surgical History:  Procedure Laterality Date   ABDOMINAL HYSTERECTOMY     BREAST CYST EXCISION Right    neg     reports that she has never smoked. She has never used smokeless tobacco. She reports that she does not drink alcohol and does not use drugs.  Allergies  Allergen Reactions   Latex Other (See Comments) and Shortness Of Breath    Delayed healing    Aspirin    Azithromycin Other (See Comments)    Hand numbness   Cefprozil Other (See Comments)    Shaking Shaking    Clarithromycin Other (See Comments)    CNS reaction Shaking    Iodinated Diagnostic Agents Hives   Levofloxacin Other (See Comments)    Shaking   Moxifloxacin    Nitrofurantoin Other (See Comments)    Shaking   Propofol    Sulfa Antibiotics    Penicillins Hives and Rash    Family History  Problem Relation  Age of Onset   Heart failure Mother    Rheum arthritis Father    Breast cancer Neg Hx     Prior to Admission medications   Medication Sig Start Date End Date Taking? Authorizing Provider  Cholecalciferol (VITAMIN D3) 400 units CAPS Take 2 tablets by mouth 2 (two) times daily.   Yes [provider]  ibuprofen (ADVIL,MOTRIN) 200 MG tablet Take 200 mg by mouth every 6 (six) hours as needed.   Yes [provider]  JARDIANCE 10 MG TABS tablet Take 10 mg by mouth daily. 03/23/21  Yes [provider]  magnesium oxide (MAG-OX) 400 MG tablet Take 400 mg by mouth daily.   Yes [provider]   rosuvastatin (CRESTOR) 5 MG tablet Take 5 mg by mouth daily. 03/23/21  Yes [provider]  valsartan (DIOVAN) 80 MG tablet Take 0.5 tablets by mouth daily. 03/23/21  Yes [provider]  vitamin B-12 (CYANOCOBALAMIN) 500 MCG tablet Take 500 mcg by mouth daily.   Yes [provider]    Physical Exam: Vitals:   03/24/21 0630 03/24/21 0700 03/24/21 0730 03/24/21 0800  BP: 130/67 (!) 133/91 124/72 122/68  Pulse: 77 80 82 83  Resp: 14 13 20 19   Temp:      TempSrc:      SpO2: 96% 99% 96% 94%  Weight:      Height:        Physical Exam Vitals and nursing note reviewed.  Constitutional:      General: She is not in acute distress.    Appearance: Normal appearance. She is normal weight. She is not ill-appearing, toxic-appearing or diaphoretic.  HENT:     Head: Normocephalic and atraumatic.     Nose: Nose normal.  Eyes:     Pupils: Pupils are equal, round, and reactive to light.  Cardiovascular:     Rate and Rhythm: Normal rate and regular rhythm.     Pulses: Normal pulses.  Pulmonary:     Effort: Pulmonary effort is normal.     Breath sounds: Normal breath sounds.  Abdominal:     General: Abdomen is flat. Bowel sounds are normal. There is no distension.     Palpations: Abdomen is soft.     Tenderness: There is no abdominal tenderness.  Musculoskeletal:     Right lower leg: No edema.     Left lower leg: No edema.  Skin:    General: Skin is warm and dry.     Capillary Refill: Capillary refill takes less than 2 seconds.  Neurological:     General: No focal deficit present.     Mental Status: She is alert and oriented to person, place, and time.     Labs on Admission: I have personally reviewed following labs and imaging studies  CBC: Recent Labs  Lab 03/24/21 0059  WBC 16.1*  HGB 16.6*  HCT 47.8*  MCV 85.8  PLT 335   Basic Metabolic Panel: Recent Labs  Lab 03/24/21 0059  NA 134*  K 3.9  CL 101  CO2 20*  GLUCOSE 196*  BUN 37*   CREATININE 1.07*  CALCIUM 9.4   GFR: Estimated Creatinine Clearance: 52 mL/min (A) (by C-G formula based on SCr of 1.07 mg/dL (H)). Liver Function Tests: No results for input(s): AST, ALT, ALKPHOS, BILITOT, PROT, ALBUMIN in the last 168 hours. No results for input(s): LIPASE, AMYLASE in the last 168 hours. No results for input(s): AMMONIA in the last 168 hours. Coagulation Profile: Recent  Labs  Lab 03/24/21 0050  INR 0.9   Cardiac Enzymes: No results for input(s): CKTOTAL, CKMB, CKMBINDEX, TROPONINI in the last 168 hours. BNP (last 3 results) No results for input(s): PROBNP in the last 8760 hours. HbA1C: No results for input(s): HGBA1C in the last 72 hours. CBG: No results for input(s): GLUCAP in the last 168 hours. Lipid Profile: No results for input(s): CHOL, HDL, LDLCALC, TRIG, CHOLHDL, LDLDIRECT in the last 72 hours. Thyroid Function Tests: No results for input(s): TSH, T4TOTAL, FREET4, T3FREE, THYROIDAB in the last 72 hours. Anemia Panel: No results for input(s): VITAMINB12, FOLATE, FERRITIN, TIBC, IRON, RETICCTPCT in the last 72 hours. Urine analysis: No results found for: COLORURINE, APPEARANCEUR, LABSPEC, PHURINE, GLUCOSEU, HGBUR, BILIRUBINUR, KETONESUR, PROTEINUR, UROBILINOGEN, NITRITE, LEUKOCYTESUR  Radiological Exams on Admission: I have personally reviewed images DG Chest 2 View  Result Date: 03/24/2021 CLINICAL DATA:  Chest pain EXAM: CHEST - 2 VIEW COMPARISON:  None. FINDINGS: Scarring in the lingula. Right lung clear. Heart is normal size. No effusions or acute bony abnormality. IMPRESSION: No active cardiopulmonary disease. Electronically Signed   By: Charlett Nose M.D.   On: 03/24/2021 01:34   US Venous Img Lower Unilateral Right (DVT)  Result Date: 03/24/2021 CLINICAL DATA:  Right calf pain. EXAM: RIGHT LOWER EXTREMITY VENOUS DOPPLER ULTRASOUND TECHNIQUE: Gray-scale sonography with graded compression, as well as color Doppler and duplex ultrasound were  performed to evaluate the lower extremity deep venous systems from the level of the common femoral vein and including the common femoral, femoral, profunda femoral, popliteal and calf veins including the posterior tibial, peroneal and gastrocnemius veins when visible. The superficial great saphenous vein was also interrogated. Spectral Doppler was utilized to evaluate flow at rest and with distal augmentation maneuvers in the common femoral, femoral and popliteal veins. COMPARISON:  None. FINDINGS: Contralateral Common Femoral Vein: Respiratory phasicity is normal and symmetric with the symptomatic side. No evidence of thrombus. Normal compressibility. Common Femoral Vein: No evidence of thrombus. Normal compressibility, respiratory phasicity and response to augmentation. Saphenofemoral Junction: No evidence of thrombus. Normal compressibility and flow on color Doppler imaging. Profunda Femoral Vein: No evidence of thrombus. Normal compressibility and flow on color Doppler imaging. Femoral Vein: No evidence of thrombus. Normal compressibility, respiratory phasicity and response to augmentation. Popliteal Vein: No evidence of thrombus. Normal compressibility, respiratory phasicity and response to augmentation. Calf Veins: No evidence of thrombus. Normal compressibility and flow on color Doppler imaging. Superficial Great Saphenous Vein: No evidence of thrombus. Normal compressibility. Venous Reflux:  None. Other Findings: No evidence of superficial thrombophlebitis or abnormal fluid collection. IMPRESSION: No evidence of right lower extremity deep vein thrombosis. Electronically Signed   By: Irish Lack M.D.   On: 03/24/2021 07:50    CT[facility] chest at [facility] on 03-23-2021 No pulmonary embolism.   Patchy bilateral groundglass opacities, greatest in the left upper lobe, suggesting infection/inflammation.     Healing nondisplaced sternal fracture.   ECHO from [facility] on 03-23-2021 Summary    1. The left  ventricle is normal in size with mildly increased wall  thickness.    2. The left ventricular systolic function is hyperdynamic, LVEF is visually  estimated at 70%.    3. The right ventricle is normal in size, with normal systolic function.    4. There are no significant valvular abnormalities.    5. Technically difficult study.    Left Ventricle    The left ventricle is normal in size with mildly increased wall thickness.  The left ventricular systolic function is hyperdynamic, LVEF is visually  estimated at 70%.    Left ventricular diastolic function cannot be accurately assessed.   Right Ventricle    The right ventricle is normal in size, with normal systolic function.    Left Atrium    The left atrium is normal in size.   Right Atrium    The right atrium is normal  in size.    Aortic Valve    The aortic valve is probably trileaflet with normal appearing leaflets with  normal excursion.    There is trivial aortic regurgitation.   Pulmonic Valve    The pulmonic valve is poorly visualized, but probably normal.   Mitral Valve    The mitral valve leaflets are normal with normal leaflet mobility.    There is trivial mitral valve regurgitation.   Tricuspid Valve    The tricuspid valve leaflets are normal, with normal leaflet mobility.    There is trivial tricuspid regurgitation.    Pericardium/Pleural    There is a trivial pericardial effusion.   Inferior Vena Cava    The IVC is not well visualized precluding the ability to accurate assess  right atrial pressure.   Aorta    The aorta is not well visualized.    Mitral Valve  ----------------------------------------------------------------------  Name                                 Value        Normal  ----------------------------------------------------------------------   MV Diastolic Function  ----------------------------------------------------------------------  MV E Peak Velocity                 52  cm/s                MV A Peak Velocity                 86 cm/s                MV E/A                                 0.6                 MV Annular TDI  ----------------------------------------------------------------------  MV Lateral e' Velocity           10.3 cm/s        >=10.0   Ventricles  ----------------------------------------------------------------------  Name                                 Value        Normal  ----------------------------------------------------------------------   LV Dimensions 2D/MM  ----------------------------------------------------------------------  IVS Diastolic Thickness (2D)        1.2 cm       0.6-0.9  LVID Diastole (2D)                  4.0 cm       3.8-5.2  LVPW Diastolic Thickness  (2D)                                1.2 cm       0.6-0.9  LVID Systole (2D)  2.1 cm       2.2-3.5    EKG: I have personally reviewed EKG: sinus tachycardia    Assessment/Plan Principal Problem:   Chest pain Active Problems:   Hypertension   Mixed hyperlipidemia   Type 2 diabetes mellitus without complication, without long-term current use of insulin (HCC)    Chest pain Admit to telemetry.  Serial troponins.  Patient requesting a cardiology consultation.  I have contacted Dr. Juliann Pares with [facility].  Patient understands that she would not be seen by cardiology until much later in the afternoon after they have finished with any invasive procedures and with clinic.  We will make her n.p.o. in the morning in case she agrees to to proceed with stress testing.  She will likely need Lexiscan testing as the patient cannot walk on a treadmill.  She has multiple medication allergies.  She has a severe IV contrast allergy requires IV steroids and IV Benadryl as premedications.  Hypertension Patient was discharged on Diovan 75 mg daily when she was discharged from [facility] yesterday.  Blood pressure remained stable now.  Mixed  hyperlipidemia Patient was discharged to home on Crestor from [facility].  She states that she has not started this.  Type 2 diabetes mellitus without complication, without long-term current use of insulin (HCC) Patient is on Jardiance at home.  We will place her on sliding scale insulin as she will be n.p.o. after midnight.  DVT prophylaxis: Lovenox Code Status: Full Code Family Communication: discussed with pt and husband at bedside  Disposition Plan: DC to home  Consults called: Dr. Juliann Pares with [facility] cardiology  Admission status: Observation, Telemetry bed   Carollee Herter, DO [facility] 03/24/2021, 9:20 AM

## 2021-03-24 NOTE — ED Notes (Signed)
Pt told about plan of admission. Is comfortable at this time
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
## 2021-03-24 NOTE — Assessment & Plan Note (Signed)
Patient is on Jardiance at home.  We will place her on sliding scale insulin as she will be n.p.o. after midnight.

## 2021-03-24 NOTE — Progress Notes (Addendum)
Brief consult note  Impression Hypertension recurrent Diabetes Hyperlipidemia GERD Anxiety . Plan Unlikely ischemia Commend hypertension management as needed with amlodipine 5 mg every 12 hrs as needed for systolic blood pressure above 761 Continue diabetes management control As needed therapy for reflux Will forego inpatient functional study and try to arrange it as an outpatient with Dr. Gwen Pounds Continue Crestor therapy for lipid management Continue reassurance for current condition Have patient call Gwen Pounds on Monday

## 2021-03-24 NOTE — Assessment & Plan Note (Signed)
Patient was discharged on Diovan 75 mg daily when she was discharged from [facility] yesterday.  Blood pressure remained stable now.

## 2021-03-27 ENCOUNTER — Other Ambulatory Visit: Payer: Self-pay | Admitting: Internal Medicine

## 2021-03-27 DIAGNOSIS — I2510 Atherosclerotic heart disease of native coronary artery without angina pectoris: Secondary | ICD-10-CM

## 2021-03-27 DIAGNOSIS — R778 Other specified abnormalities of plasma proteins: Secondary | ICD-10-CM

## 2021-04-04 ENCOUNTER — Other Ambulatory Visit (HOSPITAL_COMMUNITY): Payer: Self-pay | Admitting: Emergency Medicine

## 2021-04-04 DIAGNOSIS — R0602 Shortness of breath: Secondary | ICD-10-CM

## 2021-04-04 MED ORDER — METOPROLOL TARTRATE 100 MG PO TABS: 100.0000 mg | ORAL_TABLET | Freq: Once | ORAL | 0 refills | Status: DC

## 2021-04-04 MED ORDER — IVABRADINE HCL 5 MG PO TABS: 10.0000 mg | ORAL_TABLET | Freq: Once | ORAL | 0 refills | Status: AC

## 2021-04-05 ENCOUNTER — Encounter (HOSPITAL_COMMUNITY): Payer: Self-pay

## 2021-04-05 ENCOUNTER — Telehealth (HOSPITAL_COMMUNITY): Payer: Self-pay | Admitting: Emergency Medicine

## 2021-04-05 DIAGNOSIS — R0602 Shortness of breath: Secondary | ICD-10-CM

## 2021-04-05 MED ORDER — DIPHENHYDRAMINE HCL 50 MG PO TABS: ORAL_TABLET | ORAL | 0 refills | Status: DC

## 2021-04-05 NOTE — Telephone Encounter (Signed)
Reaching out to patient to offer assistance regarding upcoming cardiac imaging study; pt verbalizes understanding of appt date/time, parking situation and where to check in, pre-test NPO status and medications ordered, and verified current allergies; name and call back number provided for further questions should they arise Rockwell Alexandria RN Navigator Cardiac Imaging [facility] [phone] office [phone] cell  Explained the 13 hr prep medications - worked for her in the past Explained the need for BB for HR control for CCTA  Pt states shes noticed her HR has not been over 100bpm since hospital visit

## 2021-04-06 ENCOUNTER — Ambulatory Visit: Admission: RE | Admit: 2021-04-06 | Payer: Medicare PPO | Source: Ambulatory Visit

## 2021-04-07 ENCOUNTER — Other Ambulatory Visit: Payer: Self-pay

## 2021-04-07 ENCOUNTER — Ambulatory Visit (HOSPITAL_COMMUNITY)
Admission: RE | Admit: 2021-04-07 | Discharge: 2021-04-07 | Disposition: A | Discharge status: Home / Self Care | Payer: Medicare PPO | Source: Ambulatory Visit | Attending: Internal Medicine | Admitting: Internal Medicine

## 2021-04-07 DIAGNOSIS — I2510 Atherosclerotic heart disease of native coronary artery without angina pectoris: Secondary | ICD-10-CM | POA: Insufficient documentation

## 2021-04-07 DIAGNOSIS — R778 Other specified abnormalities of plasma proteins: Secondary | ICD-10-CM

## 2021-04-07 MED ORDER — DILTIAZEM HCL 25 MG/5ML IV SOLN
INTRAVENOUS | Status: AC
  Filled 2021-04-07: qty 5

## 2021-04-07 MED ORDER — DILTIAZEM HCL 25 MG/5ML IV SOLN
10.0000 mg | Freq: Once | INTRAVENOUS | Status: AC
  Administered 2021-04-07: 10 mg via INTRAVENOUS

## 2021-04-07 MED ORDER — METOPROLOL TARTRATE 5 MG/5ML IV SOLN
5.0000 mg | INTRAVENOUS | Status: DC | PRN
  Administered 2021-04-07: 5 mg via INTRAVENOUS
  Administered 2021-04-07: 10 mg via INTRAVENOUS

## 2021-04-07 MED ORDER — NITROGLYCERIN 0.4 MG SL SUBL
SUBLINGUAL_TABLET | SUBLINGUAL | Status: AC
  Filled 2021-04-07: qty 2

## 2021-04-07 MED ORDER — IOHEXOL 350 MG/ML SOLN
95.0000 mL | Freq: Once | INTRAVENOUS | Status: AC | PRN
  Administered 2021-04-07: 95 mL via INTRAVENOUS

## 2021-04-07 MED ORDER — METOPROLOL TARTRATE 5 MG/5ML IV SOLN
INTRAVENOUS | Status: AC
  Filled 2021-04-07: qty 15

## 2021-04-07 MED ORDER — NITROGLYCERIN 0.4 MG SL SUBL
0.8000 mg | SUBLINGUAL_TABLET | Freq: Once | SUBLINGUAL | Status: AC
  Administered 2021-04-07: 0.8 mg via SUBLINGUAL

## 2021-04-07 NOTE — Progress Notes (Signed)
CT scan completed. Tolerated well. D/C home in wheelchair with husband. Awake and alert. In no distress. 

## 2022-04-04 ENCOUNTER — Inpatient Hospital Stay
Admission: EM | Admit: 2022-04-04 | Discharge: 2022-04-05 | DRG: 313 | Disposition: A | Discharge status: Home / Self Care | Payer: Medicare Other | Attending: Osteopathic Medicine | Admitting: Osteopathic Medicine

## 2022-04-04 ENCOUNTER — Encounter: Payer: Self-pay | Admitting: Emergency Medicine

## 2022-04-04 ENCOUNTER — Ambulatory Visit
Admission: EM | Admit: 2022-04-04 | Discharge: 2022-04-04 | Payer: Medicare PPO | Attending: Family Medicine | Admitting: Family Medicine

## 2022-04-04 ENCOUNTER — Other Ambulatory Visit: Payer: Self-pay

## 2022-04-04 ENCOUNTER — Emergency Department: Payer: Medicare Other

## 2022-04-04 DIAGNOSIS — E782 Mixed hyperlipidemia: Secondary | ICD-10-CM

## 2022-04-04 DIAGNOSIS — I2510 Atherosclerotic heart disease of native coronary artery without angina pectoris: Secondary | ICD-10-CM | POA: Diagnosis present

## 2022-04-04 DIAGNOSIS — Z79899 Other long term (current) drug therapy: Secondary | ICD-10-CM

## 2022-04-04 DIAGNOSIS — R079 Chest pain, unspecified: Secondary | ICD-10-CM

## 2022-04-04 DIAGNOSIS — Z882 Allergy status to sulfonamides status: Secondary | ICD-10-CM

## 2022-04-04 DIAGNOSIS — Z881 Allergy status to other antibiotic agents status: Secondary | ICD-10-CM

## 2022-04-04 DIAGNOSIS — R778 Other specified abnormalities of plasma proteins: Secondary | ICD-10-CM | POA: Diagnosis present

## 2022-04-04 DIAGNOSIS — R072 Precordial pain: Secondary | ICD-10-CM

## 2022-04-04 DIAGNOSIS — Z91041 Radiographic dye allergy status: Secondary | ICD-10-CM

## 2022-04-04 DIAGNOSIS — Z789 Other specified health status: Secondary | ICD-10-CM

## 2022-04-04 DIAGNOSIS — Z8249 Family history of ischemic heart disease and other diseases of the circulatory system: Secondary | ICD-10-CM

## 2022-04-04 DIAGNOSIS — R0789 Other chest pain: Secondary | ICD-10-CM | POA: Diagnosis not present

## 2022-04-04 DIAGNOSIS — Z888 Allergy status to other drugs, medicaments and biological substances status: Secondary | ICD-10-CM | POA: Diagnosis not present

## 2022-04-04 DIAGNOSIS — I160 Hypertensive urgency: Secondary | ICD-10-CM

## 2022-04-04 DIAGNOSIS — I10 Essential (primary) hypertension: Secondary | ICD-10-CM

## 2022-04-04 DIAGNOSIS — Z7984 Long term (current) use of oral hypoglycemic drugs: Secondary | ICD-10-CM

## 2022-04-04 DIAGNOSIS — Z9049 Acquired absence of other specified parts of digestive tract: Secondary | ICD-10-CM

## 2022-04-04 DIAGNOSIS — E119 Type 2 diabetes mellitus without complications: Secondary | ICD-10-CM | POA: Diagnosis not present

## 2022-04-04 DIAGNOSIS — I214 Non-ST elevation (NSTEMI) myocardial infarction: Principal | ICD-10-CM

## 2022-04-04 DIAGNOSIS — Z880 Allergy status to penicillin: Secondary | ICD-10-CM | POA: Diagnosis not present

## 2022-04-04 DIAGNOSIS — Z886 Allergy status to analgesic agent status: Secondary | ICD-10-CM | POA: Diagnosis not present

## 2022-04-04 DIAGNOSIS — Z91040 Latex allergy status: Secondary | ICD-10-CM

## 2022-04-04 LAB — BASIC METABOLIC PANEL
Anion gap: 9 (ref 5–15)
BUN: 16 mg/dL (ref 8–23)
CO2: 26 mmol/L (ref 22–32)
Calcium: 9.4 mg/dL (ref 8.9–10.3)
Chloride: 104 mmol/L (ref 98–111)
Creatinine, Ser: 0.78 mg/dL (ref 0.44–1.00)
GFR, Estimated: >60 mL/min (ref >60)
Glucose, Bld: 175 mg/dL — ABNORMAL HIGH (ref 70–99)
Potassium: 3.8 mmol/L (ref 3.5–5.1)
Sodium: 139 mmol/L (ref 135–145)

## 2022-04-04 LAB — CBC
HCT: 47.8 % — ABNORMAL HIGH (ref 36.0–46.0)
Hemoglobin: 15.9 g/dL — ABNORMAL HIGH (ref 12.0–15.0)
MCH: 28.7 pg (ref 26.0–34.0)
MCHC: 33.3 g/dL (ref 30.0–36.0)
MCV: 86.3 fL (ref 80.0–100.0)
Platelets: 288 10*3/uL (ref 150–400)
RBC: 5.54 MIL/uL — ABNORMAL HIGH (ref 3.87–5.11)
RDW: 12 % (ref 11.5–15.5)
WBC: 8.7 10*3/uL (ref 4.0–10.5)
nRBC: 0 % (ref 0.0–0.2)

## 2022-04-04 LAB — TROPONIN I (HIGH SENSITIVITY)
Troponin I (High Sensitivity): 39 ng/L — ABNORMAL HIGH (ref <18)
Troponin I (High Sensitivity): 93 ng/L — ABNORMAL HIGH (ref <18)
Troponin I (High Sensitivity): 147 ng/L (ref <18)
Troponin I (High Sensitivity): 124 ng/L (ref <18)

## 2022-04-04 LAB — APTT: aPTT: 24 seconds (ref 24–36)

## 2022-04-04 LAB — HEPARIN LEVEL (UNFRACTIONATED): Heparin Unfractionated: <0.1 IU/mL — ABNORMAL LOW (ref 0.30–0.70)

## 2022-04-04 LAB — D-DIMER, QUANTITATIVE: D-Dimer, Quant: 0.31 ug/mL-FEU (ref 0.00–0.50)

## 2022-04-04 MED ORDER — DIPHENHYDRAMINE HCL 25 MG PO TABS: 50.0000 mg | ORAL_TABLET | Freq: Every day | ORAL | Status: DC | PRN

## 2022-04-04 MED ORDER — ASPIRIN 81 MG PO CHEW
324.0000 mg | CHEWABLE_TABLET | Freq: Once | ORAL | Status: AC
  Administered 2022-04-04: 324 mg via ORAL
  Filled 2022-04-04: qty 4

## 2022-04-04 MED ORDER — HYDRALAZINE HCL 20 MG/ML IJ SOLN
10.0000 mg | Freq: Four times a day (QID) | INTRAMUSCULAR | Status: DC | PRN
Start: 2022-04-04 — End: 2022-04-05

## 2022-04-04 MED ORDER — VITAMIN D 25 MCG (1000 UNIT) PO TABS
1000.0000 [IU] | ORAL_TABLET | Freq: Two times a day (BID) | ORAL | Status: DC
  Administered 2022-04-04 – 2022-04-05 (×2): 1000 [IU] via ORAL
  Filled 2022-04-04 (×2): qty 1

## 2022-04-04 MED ORDER — MAGNESIUM OXIDE 400 MG PO TABS
400.0000 mg | ORAL_TABLET | Freq: Every day | ORAL | Status: DC
  Administered 2022-04-05: 400 mg via ORAL
  Filled 2022-04-04 (×2): qty 1

## 2022-04-04 MED ORDER — ASPIRIN 81 MG PO TBEC
81.0000 mg | DELAYED_RELEASE_TABLET | Freq: Every day | ORAL | Status: DC
  Administered 2022-04-05: 81 mg via ORAL
  Filled 2022-04-04: qty 1

## 2022-04-04 MED ORDER — HEPARIN BOLUS VIA INFUSION
4000.0000 [IU] | Freq: Once | INTRAVENOUS | Status: DC
  Filled 2022-04-04: qty 4000

## 2022-04-04 MED ORDER — HEPARIN (PORCINE) 25000 UT/250ML-% IV SOLN: 900.0000 [IU]/h | INTRAVENOUS | Status: DC

## 2022-04-04 MED ORDER — NITROGLYCERIN 2 % TD OINT
1.0000 [in_us] | TOPICAL_OINTMENT | Freq: Four times a day (QID) | TRANSDERMAL | Status: DC
  Administered 2022-04-04: 1 [in_us] via TOPICAL
  Filled 2022-04-04: qty 1

## 2022-04-04 MED ORDER — ACETAMINOPHEN 325 MG PO TABS
650.0000 mg | ORAL_TABLET | Freq: Four times a day (QID) | ORAL | Status: DC | PRN
  Administered 2022-04-04 – 2022-04-05 (×2): 650 mg via ORAL
  Filled 2022-04-04 (×3): qty 2

## 2022-04-04 MED ORDER — ONDANSETRON 8 MG PO TBDP
8.0000 mg | ORAL_TABLET | Freq: Three times a day (TID) | ORAL | Status: DC | PRN
  Administered 2022-04-04: 8 mg via ORAL
  Filled 2022-04-04 (×2): qty 1

## 2022-04-04 MED ORDER — ONDANSETRON HCL 4 MG/2ML IJ SOLN
4.0000 mg | Freq: Once | INTRAMUSCULAR | Status: AC
  Administered 2022-04-04: 4 mg via INTRAVENOUS
  Filled 2022-04-04: qty 2

## 2022-04-04 MED ORDER — NITROGLYCERIN 2 % TD OINT: 1.0000 [in_us] | TOPICAL_OINTMENT | Freq: Four times a day (QID) | TRANSDERMAL | Status: DC

## 2022-04-04 NOTE — Progress Notes (Signed)
Notified Cardiology of Troponin 124

## 2022-04-04 NOTE — Assessment & Plan Note (Signed)
Reports she only take magnesium, vitamin D and Tylenol at home, occasionally Bendaryl  Reluctant for any prescriptions, discuss as needed

## 2022-04-04 NOTE — ED Triage Notes (Signed)
C/O some SOB, indigestion, chest pains this morning, checked BP at home, BP eleveated.

## 2022-04-04 NOTE — ED Notes (Signed)
Per patient, she had a conversation at bedside with [facility] MD who told her that heparin would be held for now until additional troponin's were resulted to trend results and determine necessity of medication. This RN told the patient that the heparin has been discontinued. Pt verbalizes understanding that if heparin is necessary in the future of her stay, that additional IV is needed

## 2022-04-04 NOTE — ED Notes (Signed)
This RN in pt room to start second IV line and Heparin drip. Pt is refusing at this time stating, "the cardiologist just said they were waiting on lab results" this RN verified with primary RN and states that the heparin drip should be started. Explained this with the patient and pt now asking for primary RN, explained that RN would not be able to come in at this time and she will when she is available.

## 2022-04-04 NOTE — ED Provider Notes (Signed)
[facility]    CSN: [id number] Arrival date & time: 04/04/22  1005      History   Chief Complaint Chief Complaint  Patient presents with   Shortness of Breath   Hypertension    HPI Nancy Guerrero is a 66 y.o. female presenting with her husband for "chest soreness", fatigue and dyspnea on exertion x2 days.  Patient reports today that she developed "indigestion."  She also reports that her blood pressure is usually normal but today when she checked it it was in the 200s systolic over 110s diastolic.  She denies any dizziness, diaphoresis, headaches, vision changes, radiation of pain, numbness or weakness of extremity, nausea or vomiting.  Patient reports that she tried Mylanta but it did not help.  She has a history of coronary artery disease, type 2 diabetes, hyperlipidemia, hypertension.  Patient does see Dr. Gwen Pounds, cardiologist.  Her next appointment is in 1 month.  Denies history of MI or stroke.  HPI  Past Medical History:  Diagnosis Date   Diabetes mellitus without complication [facility])     Patient Active Problem List   Diagnosis Date Noted   Chest pain 03/24/2021   Hypertension 03/22/2021   Mixed hyperlipidemia 03/10/2015   Type 2 diabetes mellitus without complication, without long-term current use of insulin (HCC) 11/12/2014    Past Surgical History:  Procedure Laterality Date   ABDOMINAL HYSTERECTOMY     BREAST CYST EXCISION Right    neg    OB History   No obstetric history on file.      Home Medications    Prior to Admission medications   Medication Sig Start Date End Date Taking? Authorizing Provider  Cholecalciferol (VITAMIN D3) 400 units CAPS Take 2 tablets by mouth 2 (two) times daily.   Yes [provider]  magnesium oxide (MAG-OX) 400 MG tablet Take 400 mg by mouth daily.   Yes [provider]  vitamin B-12 (CYANOCOBALAMIN) 500 MCG tablet Take 500 mcg by mouth daily.   Yes [provider]  amLODipine  (NORVASC) 5 MG tablet Take 1 tablet (5 mg total) by mouth 2 (two) times daily as needed (Systolic blood pressure over 160). Patient not taking: Reported on 04/07/2021 03/24/21   Carollee Herter, DO  diphenhydrAMINE (BENADRYL) 50 MG tablet Take 1 tablet (50mg ) 1 hr prior to CT appt 04/05/21   04/07/21, MD  ibuprofen (ADVIL,MOTRIN) 200 MG tablet Take 200 mg by mouth every 6 (six) hours as needed. Patient not taking: Reported on 04/07/2021    [provider]  JARDIANCE 10 MG TABS tablet Take 10 mg by mouth daily. Patient not taking: Reported on 04/07/2021 03/23/21   [provider]  metoprolol tartrate (LOPRESSOR) 100 MG tablet Take 1 tablet (100 mg total) by mouth once for 1 dose. Please take one time dose 100mg  metoprolol tartrate 2 hr prior to cardiac CT for HR control IF HR >55bpm. 04/04/21 04/04/21  04/06/21, MD  rosuvastatin (CRESTOR) 5 MG tablet Take 5 mg by mouth daily. Patient not taking: Reported on 04/07/2021 03/23/21   [provider]  valsartan (DIOVAN) 80 MG tablet Take 0.5 tablets by mouth daily. Patient not taking: Reported on 04/07/2021 03/23/21   [provider]    Family History Family History  Problem Relation Age of Onset   Heart failure Mother    Rheum arthritis Father    Breast cancer Neg Hx     Social History Social History   Tobacco Use   Smoking  status: Never   Smokeless tobacco: Never  Vaping Use   Vaping Use: Never used  Substance Use Topics   Alcohol use: Never   Drug use: Never     Allergies   Latex, Aspirin, Azithromycin, Cefprozil, Clarithromycin, Iodinated contrast media, Levofloxacin, Moxifloxacin, Nitrofurantoin, Propofol, Sulfa antibiotics, and Penicillins   Review of Systems Review of Systems  Constitutional:  Positive for fatigue. Negative for fever.  Respiratory:  Positive for chest tightness and shortness of breath.   Cardiovascular:  Positive for chest pain. Negative for palpitations and leg  swelling.  Gastrointestinal:  Negative for abdominal distention, nausea and vomiting.  Neurological:  Negative for dizziness, weakness, numbness and headaches.     Physical Exam Triage Vital Signs ED Triage Vitals  Enc Vitals Group     BP      Pulse      Resp      Temp      Temp src      SpO2      Weight      Height      Head Circumference      Peak Flow      Pain Score      Pain Loc      Pain Edu?      Excl. in [place]?    No data found.  Updated Vital Signs BP (!) 211/94 (BP Location: Left Arm)   Pulse 79   Temp 99.3 F (37.4 C) (Oral)   SpO2 100%   Physical Exam Vitals and nursing note reviewed.  Constitutional:      General: She is not in acute distress.    Appearance: Normal appearance. She is not ill-appearing or toxic-appearing.  HENT:     Head: Normocephalic and atraumatic.     Nose: Nose normal.     Mouth/Throat:     Mouth: Mucous membranes are moist.     Pharynx: Oropharynx is clear.  Eyes:     General: No scleral icterus.       Right eye: No discharge.        Left eye: No discharge.     Conjunctiva/sclera: Conjunctivae normal.  Cardiovascular:     Rate and Rhythm: Normal rate and regular rhythm.     Heart sounds: Normal heart sounds.  Pulmonary:     Effort: Pulmonary effort is normal. No respiratory distress.     Breath sounds: Normal breath sounds.  Musculoskeletal:     Cervical back: Neck supple.  Skin:    General: Skin is dry.  Neurological:     General: No focal deficit present.     Mental Status: She is alert. Mental status is at baseline.     Motor: No weakness.     Gait: Gait normal.  Psychiatric:        Mood and Affect: Mood normal.        Behavior: Behavior normal.        Thought Content: Thought content normal.      UC Treatments / Results  Labs (all labs ordered are listed, but only abnormal results are displayed) Labs Reviewed - No data to display  EKG   Radiology No results found.  Procedures ED EKG  Date/Time:  04/04/2022 10:41 AM  Performed by: Danton Clap, PA-C Authorized by: Lyndee Hensen, DO   ECG reviewed by ED Physician in the absence of a cardiologist: yes   Previous ECG:    Previous ECG:  Compared to current   Similarity:  Changes noted  Comparison ECG info:  No longer tachycardic Interpretation:    Interpretation: normal   Rate:    ECG rate:  82   ECG rate assessment: normal   Rhythm:    Rhythm: sinus rhythm   Ectopy:    Ectopy: none   QRS:    QRS axis:  Normal   QRS intervals:  Normal   QRS conduction: normal   ST segments:    ST segments:  Normal T waves:    T waves: normal   Comments:     Normal sinus rhythm, regular rate.  (including critical care time)  Medications Ordered in UC Medications - No data to display  Initial Impression / Assessment and Plan / UC Course  I have reviewed the triage vital signs and the nursing notes.  Pertinent labs & imaging results that were available during my care of the patient were reviewed by me and considered in my medical decision making (see chart for details).   66 year old female with history of CAD, type II DM, hypertension, hyperlipidemia presents for chest pain x2 days with worsening chest pain starting today which she describes as "indigestion and chest soreness."  Also reporting fatigue x2 days and dyspnea on exertion.  Elevated blood pressure that began today.  BP is 211/94.  No history of heart attack or stroke.  Has seen Dr. Nehemiah Massed, cardiologist 1 time last year.  Patient is overall well-appearing.  Exam is benign today.  EKG performed today shows normal sinus rhythm and regular rate.  No ST or T wave changes.  Compared to EKG from 1 year ago.  I had a discussion with patient advising her that her blood pressure is significantly elevated and with her complaint of chest pain, dyspnea on exertion and fatigue she needs to be evaluated in the emergency department ASAP.  Advised that she needs to have serial troponins  drawn and cardiac work-up.  Advised EKG alone does not 100% rule out serious heart condition.  Suspect patient would be in emergency department for several hours to have cardiac work-up and potential admission depending on results.   Patient asked if she can contact her cardiologist to see if they will do it.  Advised her she may contact her cardiologist but they are not going to do it in their office and will recommend going to the emergency department.  Advised patient to go to ER at this time without delay.  She seems hesitant to go home.  Her husband is with her and they state they will go to [facility] at this time.  No interest in EMS.  Patient is leaving in stable condition at this time.   Final Clinical Impressions(s) / UC Diagnoses   Final diagnoses:  Chest pain, unspecified type  Hypertensive urgency     Discharge Instructions      You have been advised to follow up immediately in the emergency department for concerning signs.symptoms. If you declined EMS transport, please have a family member take you directly to the ED at this time. Do not delay. Based on concerns about condition, if you do not follow up in th e ED, you may risk poor outcomes including worsening of condition, delayed treatment and potentially life threatening issues. If you have declined to go to the ED at this time, you should call your PCP immediately to set up a follow up appointment.  Go to ED for red flag symptoms, including; fevers you cannot reduce with Tylenol/Motrin, severe headaches, vision changes, numbness/weakness in part  of the body, lethargy, confusion, intractable vomiting, severe dehydration, chest pain, breathing difficulty, severe persistent abdominal or pelvic pain, signs of severe infection (increased redness, swelling of an area), feeling faint or passing out, dizziness, etc. You should especially go to the ED for sudden acute worsening of condition if you do not elect to go at this time.      ED  Prescriptions   None    PDMP not reviewed this encounter.   Shirlee Latch, PA-C 04/04/22 1042

## 2022-04-04 NOTE — Assessment & Plan Note (Signed)
Cardiology consulted (Dr Juliann Pares) --> atypical chest pain more likely than ACS  Follow troponins and if still climbing please call cardiology re: heparin (pt prefers to stay off heparin if possible and asks we call the cardiology team for decision about the heart)   Myoscan in morning   Advised BP control, statin

## 2022-04-04 NOTE — Discharge Instructions (Signed)

## 2022-04-04 NOTE — ED Notes (Signed)
RN aware bed assigned ?

## 2022-04-04 NOTE — H&P (Signed)
History and Physical    Patient: Nancy Guerrero XAJ:[id number] DOB: 12/06/1955 DOA: 04/04/2022 DOS: the patient was seen and examined on 04/04/2022 PCP: Enid Baas, MD  Patient coming from: Home  Chief Complaint: Chest pain   HPI: Nancy Guerrero is a 66 y.o. female with medical history significant of chest pain described as soreness x2 days, nothing improves it (rest or antacids), nothing makes it worse (okay with exertion), located centrally and she reports hx sternum fracture. Checked BP at home and was high, usually it's 110s systolic - she does not take any prescription medications at home. Reports indigestion.   Hospital course: Nancy Guerrero is a 66 year old female who  has a past medical history of Diabetes  Type 2, HTN (previously prescribed amlodipine 5 mg, metoprolol tartrate 100 mg bid, valsartan 40 mg daily), HLD (previously prescribed rosuvastatin 5 mg). Went to urgent care this morning for chest pain x2 days and was advised go to ED. BP at home 206/115 this morning. CP associated w/ soreness in her back and some indigestion  08/23: In ED - received ASA, nitro, Zofran. BP 213/98, 170/82. Troponin 39 --> 93. DDimer WNL, no concerns on CBC/CMP. EKG no changes. ED physician (Dr Modesto Charon) spoke w/ cardiology (Dr Juliann Pares). Pt started on Heparin and admitted to hospitalist service for atypical chest pain. She prefers to stay off prescriptions if possible, will defer heparin per cardiology but follow troponins    Review of Systems: As mentioned in the history of present illness. All other systems reviewed and are negative. Past Medical History:  Diagnosis Date   Diabetes mellitus without complication (HCC)    Past Surgical History:  Procedure Laterality Date   ABDOMINAL HYSTERECTOMY     BREAST CYST EXCISION Right    neg   Social History:  reports that she has never smoked. She has never used smokeless tobacco. She reports that she does not drink alcohol and does not use  drugs.  Allergies  Allergen Reactions   Latex Other (See Comments) and Shortness Of Breath    Delayed healing    Aspirin    Azithromycin Other (See Comments)    Hand numbness   Cefprozil Other (See Comments)    Shaking Shaking    Clarithromycin Other (See Comments)    CNS reaction Shaking    Iodinated Contrast Media Hives   Levofloxacin Other (See Comments)    Shaking   Moxifloxacin    Nitrofurantoin Other (See Comments)    Shaking   Propofol    Sulfa Antibiotics    Penicillins Hives and Rash    Family History  Problem Relation Age of Onset   Heart failure Mother    Rheum arthritis Father    Breast cancer Neg Hx     Prior to Admission medications   Pt states only takes magnesium, vitamin D, Tylenol, Benadryl                                                                               Physical Exam: Vitals:   04/04/22 1600 04/04/22 1600 04/04/22 1630 04/04/22 1700  BP: (!) 170/86  (!) 160/87 136/84  Pulse: 82  86 87  Resp: 16  15  Temp:      TempSrc:      SpO2: 94% 96% 95% 96%  Weight:      Height:       Constitutional:  VSS, see nurse notes General Appearance: alert, well-developed, well-nourished, NAD Eyes: Normal lids and conjunctive, non-icteric sclera PERRLA Ears, Nose, Mouth, Throat: Normal appearance Normal external auditory canal and TM bilaterally MMM, posterior pharynx without erythema/exudate Neck: No masses, trachea midline No thyroid enlargement/tenderness/mass appreciated Respiratory: Normal respiratory effort No dullness/hyper-resonance to percussion Breath sounds normal, no wheeze/rhonchi/rales Cardiovascular: S1/S2 normal, no murmur/rub/gallop auscultated No carotid bruit or JVD Pedal pulse II/IV bilaterally DP and PT No lower extremity edema No chest wall tenderness  Gastrointestinal: Nontender, no masses No hepatomegaly, no splenomegaly No hernia appreciated Musculoskeletal:  Gait normal No  clubbing/cyanosis of digits Neurological: No cranial nerve deficit on limited exam Motor and sensation intact and symmetric Psychiatric: Normal judgment/insight Normal mood and affect  Data Reviewed: Results for orders placed or performed during the hospital encounter of 04/04/22 (from the past 24 hour(s))  Basic metabolic panel     Status: Abnormal   Collection Time: 04/04/22 11:26 AM  Result Value Ref Range   Sodium 139 135 - 145 mmol/L   Potassium 3.8 3.5 - 5.1 mmol/L   Chloride 104 98 - 111 mmol/L   CO2 26 22 - 32 mmol/L   Glucose, Bld 175 (H) 70 - 99 mg/dL   BUN 16 8 - 23 mg/dL   Creatinine, Ser 0.78 0.44 - 1.00 mg/dL   Calcium 9.4 8.9 - 10.3 mg/dL   GFR, Estimated >60 >60 mL/min   Anion gap 9 5 - 15  CBC     Status: Abnormal   Collection Time: 04/04/22 11:26 AM  Result Value Ref Range   WBC 8.7 4.0 - 10.5 K/uL   RBC 5.54 (H) 3.87 - 5.11 MIL/uL   Hemoglobin 15.9 (H) 12.0 - 15.0 g/dL   HCT 47.8 (H) 36.0 - 46.0 %   MCV 86.3 80.0 - 100.0 fL   MCH 28.7 26.0 - 34.0 pg   MCHC 33.3 30.0 - 36.0 g/dL   RDW 12.0 11.5 - 15.5 %   Platelets 288 150 - 400 K/uL   nRBC 0.0 0.0 - 0.2 %  Troponin I (High Sensitivity)     Status: Abnormal   Collection Time: 04/04/22 11:26 AM  Result Value Ref Range   Troponin I (High Sensitivity) 39 (H) <18 ng/L  Troponin I (High Sensitivity)     Status: Abnormal   Collection Time: 04/04/22  2:27 PM  Result Value Ref Range   Troponin I (High Sensitivity) 93 (H) <18 ng/L  D-dimer, quantitative     Status: None   Collection Time: 04/04/22  2:27 PM  Result Value Ref Range   D-Dimer, Quant 0.31 0.00 - 0.50 ug/mL-FEU   DG Chest 2 View  Result Date: 04/04/2022 CLINICAL DATA:  Chest pain EXAM: CHEST - 2 VIEW COMPARISON:  March 24, 2021, April 07, 2021 FINDINGS: The cardiomediastinal silhouette is unchanged in contour. No acute pleuroparenchymal abnormality. No pleural effusion. No pneumothorax. Visualized abdomen is unremarkable. Multilevel  degenerative changes of the thoracic spine. IMPRESSION: No acute cardiopulmonary disease. Electronically Signed   By: Beryle Flock M.D.   On: 04/04/2022 11:41           Assessment and Plan:  * Chest pain Cardiology consulted (Dr Clayborn Bigness) --> atypical chest pain more likely than ACS Follow troponins and if still climbing please call cardiology re: heparin (  pt prefers to stay off heparin if possible and asks we call the cardiology team for decision about the heart)  Myoscan in morning  Advised BP control, statin  Medication intolerance Reports she only take magnesium, vitamin D and Tylenol at home, occasionally Bendaryl Reluctant for any prescriptions, discuss as needed   Type 2 diabetes mellitus without complication, without long-term current use of insulin (HCC) A1C pending  No home meds   Mixed hyperlipidemia Not on statin at home Cardiology recommends statin, pt would like to defer this Discuss prior to discharge / if ACS diagnosed   Hypertension BP improved without meds Pt would like to defer medications Will discuss further w/ patient if ACS diagnosed / if BP high  Hydralazine is ordered prn      Advance Care Planning:   Code Status: Full Code   Consults: Cardiology   Family Communication: husband at bedside   Severity of Illness: The appropriate patient status for this patient is INPATIENT. Inpatient status is judged to be reasonable and necessary in order to provide the required intensity of service to ensure the patient's safety. The patient's presenting symptoms, physical exam findings, and initial radiographic and laboratory data in the context of their chronic comorbidities is felt to place them at high risk for further clinical deterioration. Furthermore, it is not anticipated that the patient will be medically stable for discharge from the hospital within 2 midnights of admission.   * I certify that at the point of admission it is my clinical judgment  that the patient will require inpatient hospital care spanning beyond 2 midnights from the point of admission due to high intensity of service, high risk for further deterioration and high frequency of surveillance required.*  Author: Sunnie Nielsen, DO 04/04/2022 5:47 PM  For on call review [email or web address].

## 2022-04-04 NOTE — Assessment & Plan Note (Signed)
   A1C pending   No home meds

## 2022-04-04 NOTE — ED Provider Notes (Addendum)
[facility] Provider Note    Event Date/Time   First MD Initiated Contact with Patient 04/04/22 1211     (approximate)   History   No chief complaint on file.   HPI  Nancy Guerrero is a 66 y.o. female   Past medical history of hypertension, diabetes, hyperlipidemia presents with exertional chest discomfort over the past week that is increasing in frequency.  First occurrence started when she was chopping food vigorously subsided with rest.  The second occurrence later that week while walking subsided with rest.  Today has become constant and unchanging.  She attributes to indigestion.  Exertional shortness of breath, none at rest.  No nausea or vomiting.  Patient states that he has no prior health history and takes no medications despite my medical chart review suggesting multiple cardiac risk factors including hypertension, diabetes, history of CAD.  History was obtained via the patient and her husband who is at bedside and a review of external medical chart      Physical Exam   Triage Vital Signs: ED Triage Vitals  Enc Vitals Group     BP 04/04/22 1112 (!) 213/98     Pulse Rate 04/04/22 1112 86     Resp 04/04/22 1112 16     Temp 04/04/22 1112 99 F (37.2 C)     Temp Source 04/04/22 1112 Oral     SpO2 04/04/22 1112 98 %     Weight 04/04/22 1111 160 lb 0.9 oz (72.6 kg)     Height 04/04/22 1111 5\' 6"  (1.676 m)     Head Circumference --      Peak Flow --      Pain Score 04/04/22 1110 3     Pain Loc --      Pain Edu? --      Excl. in GC? --     Most recent vital signs: Vitals:   04/04/22 1112 04/04/22 1230  BP: (!) 213/98 (!) 170/82  Pulse: 86 81  Resp: 16   Temp: 99 F (37.2 C)   SpO2: 98% 99%    General: Awake, no distress.  She is pleasant, alert, conversant.  Anxious appearing. CV:  Good peripheral perfusion.  Radial pulses intact and equal bilaterally.  No murmurs.  Regular rate and rhythm. Resp:  Normal effort.  Clear to  auscultation bilaterally Abd:  No distention.    ED Results / Procedures / Treatments   Labs (all labs ordered are listed, but only abnormal results are displayed) Labs Reviewed  BASIC METABOLIC PANEL - Abnormal; Notable for the following components:      Result Value   Glucose, Bld 175 (*)    All other components within normal limits  CBC - Abnormal; Notable for the following components:   RBC 5.54 (*)    Hemoglobin 15.9 (*)    HCT 47.8 (*)    All other components within normal limits  TROPONIN I (HIGH SENSITIVITY) - Abnormal; Notable for the following components:   Troponin I (High Sensitivity) 39 (*)    All other components within normal limits  TROPONIN I (HIGH SENSITIVITY) - Abnormal; Notable for the following components:   Troponin I (High Sensitivity) 93 (*)    All other components within normal limits  D-DIMER, QUANTITATIVE  HEPARIN LEVEL (UNFRACTIONATED)  APTT     I reviewed labs and they are notable for a troponin of 30s that increased to 90s on recheck.  EKG  ED ECG REPORT I, 07-25-1979,  the attending physician, personally viewed and interpreted this ECG.   Date: 04/04/2022  EKG Time:1123   Rate: 75  Rhythm: normal EKG, normal sinus rhythm, PAC's noted QTc 422, no ischemic changes noted.    RADIOLOGY I dependently reviewed and interpreted x-ray and see no focal consolidations or pneumothorax.  Refer to radiology for final read.   PROCEDURES:  Critical Care performed: No  .Critical Care  Performed by: Pilar Jarvis, MD Authorized by: Pilar Jarvis, MD   Critical care provider statement:    Critical care time (minutes):  30   Critical care was necessary to treat or prevent imminent or life-threatening deterioration of the following conditions:  Cardiac failure   Critical care was time spent personally by me on the following activities:  Ordering and performing treatments and interventions, ordering and review of laboratory studies, ordering and review  of radiographic studies, re-evaluation of patient's condition, review of old charts, discussions with consultants, evaluation of patient's response to treatment, examination of patient and development of treatment plan with patient or surrogate   Care discussed with: admitting provider      MEDICATIONS ORDERED IN ED: Medications  nitroGLYCERIN (NITROGLYN) 2 % ointment 1 inch (1 inch Topical Given 04/04/22 1541)  heparin ADULT infusion 100 units/mL (25000 units/234mL) (has no administration in time range)  heparin bolus via infusion 4,000 Units (has no administration in time range)  aspirin chewable tablet 324 mg (324 mg Oral Given 04/04/22 1424)  ondansetron (ZOFRAN) injection 4 mg (4 mg Intravenous Given 04/04/22 1425)    Consultants:  I spoke with cardiology regarding care plan for this patient.   IMPRESSION / MDM / ASSESSMENT AND PLAN / ED COURSE  I reviewed the triage vital signs and the nursing notes.                              Differential diagnosis includes, but is not limited to, unstable angina, ACS, considered but less likely respiratory infection, PE, dissection.   The patient is on the cardiac monitor to evaluate for evidence of arrhythmia and/or significant heart rate changes.  MDM: Patient was found to have elevated troponin from 30-90.  Less likely PE or dissection given symptomatology more consistent with unstable angina, got a dimer which was negative.  I discussed the case with cardiologist , started the patient on a heparin drip and Nitropaste and admission.   Patient's presentation is most consistent with acute presentation with potential threat to life or bodily function.       FINAL CLINICAL IMPRESSION(S) / ED DIAGNOSES   Final diagnoses:  NSTEMI (non-ST elevated myocardial infarction) (HCC)     Rx / DC Orders   ED Discharge Orders     None        Note:  This document was prepared using Dragon voice recognition software and may include  unintentional dictation errors.    Pilar Jarvis, MD 04/04/22 1550    Pilar Jarvis, MD 04/04/22 [phone]

## 2022-04-04 NOTE — ED Triage Notes (Signed)
Pt developed SOB 2 days ago.Pt has soreness in her back and some indigestion. She checked her BP this morning and it was 206/115. Pt denies HA, blurred vision or dizziness.

## 2022-04-04 NOTE — Consult Note (Signed)
Brief cardiology consult note  Asked to see a patient with chest pain symptoms Elevated troponins Poorly controlled hypertension Diabetes Hyperlipidemia  EKG nondiagnostic  Exam unremarkable  Conclusion atypical chest pain Possible demand ischemia   Plan Continue aspirin 81 mg a day Patient prefers not to take heparin Continue to follow troponins Recommend functional study in the morning Continue blood pressure medication Continue diabetes medication Agree with statin therapy Continue amlodipine metoprolol and valsartan for blood pressure management and control   Full consult note to follow

## 2022-04-04 NOTE — Hospital Course (Addendum)
Nancy Guerrero is a 66 year old female who  has a past medical history of Diabetes  Type 2, HTN (previously prescribed amlodipine 5 mg, metoprolol tartrate 100 mg bid, valsartan 40 mg daily), HLD (previously prescribed rosuvastatin 5 mg). Went to urgent care this morning for chest pain x2 days and was advised go to ED. BP at home 206/115 this morning. CP associated w/ soreness in her back and some indigestion  08/23: In ED - received ASA, nitro, Zofran. BP 213/98, 170/82. Troponin 39 --> 93. DDimer WNL, no concerns on CBC/CMP. EKG no changes. ED physician (Dr Modesto Charon) spoke w/ cardiology (Dr Juliann Pares). Pt started on Heparin and admitted to hospitalist service for atypical chest pain. She prefers to stay off prescriptions if possible, will defer heparin per cardiology but follow troponins.  08/24: Dr Juliann Pares notified overnight of troponin --> 124. NM Myoscan today no concerns / low risk. OK for discharge per cardiology. Troponin trend 39-93-147-124-98-53. Plan follow-up with Dr. Gwen Pounds in 1 to 2 weeks

## 2022-04-04 NOTE — Progress Notes (Signed)
Date and time results received: 04/04/22 0730  Test: Troponin  Critical Value: 147  Name of Provider Notified: Dr. Juliann Pares  Orders Received?  none

## 2022-04-04 NOTE — Consult Note (Addendum)
ANTICOAGULATION CONSULT NOTE - Initial Consult  Pharmacy Consult for heparin infusion Indication: chest pain/ACS  Allergies  Allergen Reactions   Latex Other (See Comments) and Shortness Of Breath    Delayed healing    Aspirin    Azithromycin Other (See Comments)    Hand numbness   Cefprozil Other (See Comments)    Shaking Shaking    Clarithromycin Other (See Comments)    CNS reaction Shaking    Iodinated Contrast Media Hives   Levofloxacin Other (See Comments)    Shaking   Moxifloxacin    Nitrofurantoin Other (See Comments)    Shaking   Propofol    Sulfa Antibiotics    Penicillins Hives and Rash    Patient Measurements: Height: 5\' 6"  (167.6 cm) Weight: 72.6 kg (160 lb 0.9 oz) IBW/kg (Calculated) : 59.3 Heparin Dosing Weight: 72.6 kg   Vital Signs: Temp: 99 F (37.2 C) (08/23 1112) Temp Source: Oral (08/23 1112) BP: 170/82 (08/23 1230) Pulse Rate: 81 (08/23 1230)  Labs: Recent Labs    04/04/22 1126 04/04/22 1427  HGB 15.9*  --   HCT 47.8*  --   PLT 288  --   CREATININE 0.78  --   TROPONINIHS 39* 93*    Estimated Creatinine Clearance: 68.6 mL/min (by C-G formula based on SCr of 0.78 mg/dL).   Medical History: Past Medical History:  Diagnosis Date   Diabetes mellitus without complication (HCC)     Medications:  PTA: N/A Inpatient: Heparin infusion (8/23 >> ) Allergies: Aspirin allergy, unspecified reaction  Assessment: 66yo female presenting with her husband for "chest soreness", fatigue and dyspnea on exertion x2 days.   She has a history of coronary artery disease, type 2 diabetes, hyperlipidemia, hypertension. No hx heart attack or stroke. EKG performed today shows normal sinus rhythm and regular rate.  No ST or T wave changes. Pharmacy consulted for management of heparin infusion in the setting of ACS/STEMI   Goal of Therapy:  Heparin level 0.3-0.7 units/ml Monitor platelets by anticoagulation protocol: Yes    Date Time aPTT/HL Rate/Comment       Plan:  Order STAT aPTT Give 4000 units bolus x1; then start heparin infusion at 900 units/hr Check anti-Xa level in 6 hours and daily once consecutively therapeutic. Continue to monitor H&H and platelets daily while on heparin gtt.  66yo 04/04/2022,3:25 PM

## 2022-04-04 NOTE — Assessment & Plan Note (Signed)
BP improved without meds  Pt would like to defer medications  Will discuss further w/ patient if ACS diagnosed / if BP high   Hydralazine is ordered prn

## 2022-04-04 NOTE — Assessment & Plan Note (Signed)
Not on statin at home  Cardiology recommends statin, pt would like to defer this  Discuss prior to discharge / if ACS diagnosed

## 2022-04-04 NOTE — ED Notes (Signed)
At this time patient is refusing the additional peripheral IV, and heparin administration, per my charge RN who went in to get therapy started. Pt requesting to speak with her primary RN (this Clinical research associate) prior to receiving second IV and heparin. At time of pt request to speak with primary RN, this RN was busy with another patient and charge RN relayed to the patient that this RN would be in to speak with her as soon as possible.

## 2022-04-05 ENCOUNTER — Inpatient Hospital Stay: Payer: Medicare Other

## 2022-04-05 ENCOUNTER — Encounter: Payer: Self-pay | Admitting: Osteopathic Medicine

## 2022-04-05 DIAGNOSIS — I10 Essential (primary) hypertension: Secondary | ICD-10-CM | POA: Diagnosis not present

## 2022-04-05 DIAGNOSIS — Z789 Other specified health status: Secondary | ICD-10-CM | POA: Diagnosis not present

## 2022-04-05 DIAGNOSIS — E782 Mixed hyperlipidemia: Secondary | ICD-10-CM | POA: Diagnosis not present

## 2022-04-05 DIAGNOSIS — R0789 Other chest pain: Secondary | ICD-10-CM | POA: Diagnosis not present

## 2022-04-05 DIAGNOSIS — R072 Precordial pain: Secondary | ICD-10-CM | POA: Diagnosis not present

## 2022-04-05 DIAGNOSIS — R079 Chest pain, unspecified: Secondary | ICD-10-CM | POA: Diagnosis not present

## 2022-04-05 LAB — CBC
HCT: 44.5 % (ref 36.0–46.0)
Hemoglobin: 14.6 g/dL (ref 12.0–15.0)
MCH: 28.7 pg (ref 26.0–34.0)
MCHC: 32.8 g/dL (ref 30.0–36.0)
MCV: 87.4 fL (ref 80.0–100.0)
Platelets: 242 10*3/uL (ref 150–400)
RBC: 5.09 MIL/uL (ref 3.87–5.11)
RDW: 12.3 % (ref 11.5–15.5)
WBC: 9.8 10*3/uL (ref 4.0–10.5)
nRBC: 0 % (ref 0.0–0.2)

## 2022-04-05 LAB — NM MYOCAR MULTI W/SPECT W/WALL MOTION / EF
Base ST Depression (mm): 0 mm
LV dias vol: 42 mL (ref 46–106)
LV sys vol: 13 mL
Nuc Stress EF: 69 %
Rest Nuclear Isotope Dose: 9.9 mCi
SDS: 3
SRS: 1
SSS: 4
ST Depression (mm): 0 mm
Stress Nuclear Isotope Dose: 31.6 mCi
TID: 0.88

## 2022-04-05 LAB — LIPID PANEL
Cholesterol: 219 mg/dL — ABNORMAL HIGH (ref 0–200)
HDL: 36 mg/dL — ABNORMAL LOW (ref >40)
LDL Cholesterol: 138 mg/dL — ABNORMAL HIGH (ref 0–99)
Total CHOL/HDL Ratio: 6.1 RATIO
Triglycerides: 224 mg/dL — ABNORMAL HIGH (ref <150)
VLDL: 45 mg/dL — ABNORMAL HIGH (ref 0–40)

## 2022-04-05 LAB — TROPONIN I (HIGH SENSITIVITY)
Troponin I (High Sensitivity): 98 ng/L — ABNORMAL HIGH (ref <18)
Troponin I (High Sensitivity): 53 ng/L — ABNORMAL HIGH (ref <18)

## 2022-04-05 LAB — HEMOGLOBIN A1C
Hgb A1c MFr Bld: 7.2 % — ABNORMAL HIGH (ref 4.8–5.6)
Mean Plasma Glucose: 159.94 mg/dL

## 2022-04-05 LAB — BASIC METABOLIC PANEL
Anion gap: 10 (ref 5–15)
BUN: 18 mg/dL (ref 8–23)
CO2: 24 mmol/L (ref 22–32)
Calcium: 8.7 mg/dL — ABNORMAL LOW (ref 8.9–10.3)
Chloride: 105 mmol/L (ref 98–111)
Creatinine, Ser: 0.86 mg/dL (ref 0.44–1.00)
GFR, Estimated: >60 mL/min (ref >60)
Glucose, Bld: 151 mg/dL — ABNORMAL HIGH (ref 70–99)
Potassium: 3.7 mmol/L (ref 3.5–5.1)
Sodium: 139 mmol/L (ref 135–145)

## 2022-04-05 MED ORDER — TECHNETIUM TC 99M TETROFOSMIN IV KIT
30.5600 | PACK | Freq: Once | INTRAVENOUS | Status: AC | PRN
  Administered 2022-04-05: 30.56 via INTRAVENOUS

## 2022-04-05 MED ORDER — ASPIRIN 81 MG PO TBEC: 81.0000 mg | DELAYED_RELEASE_TABLET | Freq: Every day | ORAL | 12 refills | Status: AC

## 2022-04-05 MED ORDER — REGADENOSON 0.4 MG/5ML IV SOLN
0.4000 mg | Freq: Once | INTRAVENOUS | Status: AC
  Administered 2022-04-05: 0.4 mg via INTRAVENOUS
  Filled 2022-04-05: qty 5

## 2022-04-05 MED ORDER — TECHNETIUM TC 99M TETROFOSMIN IV KIT
9.9300 | PACK | Freq: Once | INTRAVENOUS | Status: AC | PRN
  Administered 2022-04-05: 9.93 via INTRAVENOUS

## 2022-04-05 MED ORDER — ACETAMINOPHEN 325 MG PO TABS: 650.0000 mg | ORAL_TABLET | Freq: Four times a day (QID) | ORAL | Status: AC | PRN
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
## 2022-04-05 NOTE — Discharge Summary (Signed)
Physician Discharge Summary   Patient: Nancy Guerrero MRN: [id number]  DOB: 24-Nov-1955   Admit:     Date of Admission: 04/04/2022 Admitted from: home   Discharge: Date of discharge: 04/05/22 Disposition: Home Condition at discharge: good  CODE STATUS: FULL     Discharge Physician: Sunnie Nielsen, DO [facility]     PCP: Enid Baas, MD  Recommendations for Outpatient Follow-up:  Follow up with PCP Enid Baas, MD in 1-2 weeks Please obtain labs/tests: BMP, CBC Please follow up on the following pending results: none Please ensure BP is controlled and outpatient cardiology follow up in place \   Discharge Instructions     Diet - low sodium heart healthy   Complete by: As directed    Increase activity slowly   Complete by: As directed           Hospital Course: Nancy Guerrero is a 67 year old female who  has a past medical history of Diabetes  Type 2, HTN (previously prescribed amlodipine 5 mg, metoprolol tartrate 100 mg bid, valsartan 40 mg daily), HLD (previously prescribed rosuvastatin 5 mg). Went to urgent care this morning for chest pain x2 days and was advised go to ED. BP at home 206/115 this morning. CP associated w/ soreness in her back and some indigestion  08/23: In ED - received ASA, nitro, Zofran. BP 213/98, 170/82. Troponin 39 --> 93. DDimer WNL, no concerns on CBC/CMP. EKG no changes. ED physician (Dr Modesto Charon) spoke w/ cardiology (Dr Juliann Pares). Pt started on Heparin and admitted to hospitalist service for atypical chest pain. She prefers to stay off prescriptions if possible, will defer heparin per cardiology but follow troponins.  08/24: Dr Juliann Pares notified overnight of troponin --> 124. NM Myoscan today no concerns / low risk. OK for discharge per cardiology. Troponin trend [id number]. Plan follow-up with Dr. Gwen Pounds in 1 to 2 weeks   Consultants:  Cardiology   Procedures:  Lexiscan Myoview stress test 04/05/22        Discharge Diagnoses: Principal Problem:   Chest pain Active Problems:   Hypertension   Mixed hyperlipidemia   Type 2 diabetes mellitus without complication, without long-term current use of insulin (HCC)   Medication intolerance    Assessment & Plan:  Chest pain Cardiology consulted (Dr Juliann Pares) --> atypical chest pain more likely than ACS Troponins peaked at 147 then down to 53  Myoscan 04/05/22 was low risk  Advised BP control, statin - pt would like to defer Rx unless absolutely needed, will plan to follow outpatient  close follow-up with cardiology w/ Dr. Gwen Pounds in 1 to 2 weeks  Type 2 diabetes mellitus without complication, without long-term current use of insulin (HCC) A1C 7.2  No home meds  Follow outpatient   Mixed hyperlipidemia Not on statin at home Cardiology recommends statin, pt would like to defer this  Hypertension BP improved without meds Pt would like to defer medications Will discuss further w/ patient if ACS diagnosed / if BP high  Hydralazine is ordered prn  Medication intolerance Reports she only take magnesium, vitamin D and Tylenol at home, occasionally Bendaryl Reluctant for any prescriptions, discuss as needed       Discharge Instructions  Allergies as of 04/05/2022       Reactions   Latex Other (See Comments), Shortness Of Breath   Delayed healing   Aspirin    Azithromycin Other (See Comments)   Hand numbness   Cefprozil Other (See Comments)   Shaking  Shaking   Clarithromycin Other (See Comments)   CNS reaction Shaking   Iodinated Contrast Media Hives   Levofloxacin Other (See Comments)   Shaking   Moxifloxacin    Nitrofurantoin Other (See Comments)   Shaking   Propofol    Sulfa Antibiotics    Penicillins Hives, Rash        Medication List     STOP taking these medications    amLODipine 5 MG tablet Commonly known as: NORVASC   diphenhydrAMINE 50 MG tablet Commonly known as: BENADRYL   ibuprofen  200 MG tablet Commonly known as: ADVIL   Jardiance 10 MG Tabs tablet Generic drug: empagliflozin   metoprolol tartrate 100 MG tablet Commonly known as: LOPRESSOR   rosuvastatin 5 MG tablet Commonly known as: CRESTOR   valsartan 80 MG tablet Commonly known as: DIOVAN   vitamin B-12 500 MCG tablet Commonly known as: CYANOCOBALAMIN       TAKE these medications    acetaminophen 325 MG tablet Commonly known as: TYLENOL Take 2 tablets (650 mg total) by mouth every 6 (six) hours as needed for mild pain, moderate pain or headache.   aspirin EC 81 MG tablet Take 1 tablet (81 mg total) by mouth daily. Swallow whole. Start taking on: April 06, 2022   magnesium oxide 400 MG tablet Commonly known as: MAG-OX Take 400 mg by mouth daily.   Vitamin D3 10 MCG (400 UNIT) Caps Take 2 tablets by mouth 2 (two) times daily.         Follow-up Information     Lamar Blinks, MD. Go in 2 week(s).   Specialty: Cardiology Why: Appointment on Thursday, 04/19/2022 at 11:00am Contact information: [address] [facility] [place] [address] [phone]                 Allergies  Allergen Reactions   Latex Other (See Comments) and Shortness Of Breath    Delayed healing    Aspirin    Azithromycin Other (See Comments)    Hand numbness   Cefprozil Other (See Comments)    Shaking Shaking    Clarithromycin Other (See Comments)    CNS reaction Shaking    Iodinated Contrast Media Hives   Levofloxacin Other (See Comments)    Shaking   Moxifloxacin    Nitrofurantoin Other (See Comments)    Shaking   Propofol    Sulfa Antibiotics    Penicillins Hives and Rash     Subjective: pt states no chest pain or SOB, no dizziness, mild headache responsded to tylenol, pt feels ok to go home today    Discharge Exam: BP 128/76 (BP Location: Left Arm)   Pulse 82   Temp 98.4 F (36.9 C)   Resp 18   Ht 5\' 6"  (1.676 m)   Wt 70.6 kg   SpO2 98%    BMI 25.12 kg/m  General: Pt is alert, awake, not in acute distress Cardiovascular: RRR, S1/S2 +, no rubs, no gallops Respiratory: CTA bilaterally, no wheezing, no rhonchi Abdominal: Soft, NT, ND, bowel sounds + Extremities: no edema, no cyanosis     The results of significant diagnostics from this hospitalization (including imaging, microbiology, ancillary and laboratory) are listed below for reference.     Microbiology: No results found for this or any previous visit (from the past 240 hour(s)).   Labs: BNP (last 3 results) No results for input(s): "BNP" in the last 8760 hours. Basic Metabolic Panel: Recent Labs  Lab 04/04/22  1126 04/05/22 0406  NA 139 139  K 3.8 3.7  CL 104 105  CO2 26 24  GLUCOSE 175* 151*  BUN 16 18  CREATININE 0.78 0.86  CALCIUM 9.4 8.7*   Liver Function Tests: No results for input(s): "AST", "ALT", "ALKPHOS", "BILITOT", "PROT", "ALBUMIN" in the last 168 hours. No results for input(s): "LIPASE", "AMYLASE" in the last 168 hours. No results for input(s): "AMMONIA" in the last 168 hours. CBC: Recent Labs  Lab 04/04/22 1126 04/05/22 0406  WBC 8.7 9.8  HGB 15.9* 14.6  HCT 47.8* 44.5  MCV 86.3 87.4  PLT 288 242   Cardiac Enzymes: No results for input(s): "CKTOTAL", "CKMB", "CKMBINDEX", "TROPONINI" in the last 168 hours. BNP: Invalid input(s): "POCBNP" CBG: No results for input(s): "GLUCAP" in the last 168 hours. D-Dimer Recent Labs    04/04/22 1427  DDIMER 0.31   Hgb A1c Recent Labs    04/05/22 0406  HGBA1C 7.2*   Lipid Profile Recent Labs    04/05/22 0406  CHOL 219*  HDL 36*  LDLCALC 138*  TRIG 224*  CHOLHDL 6.1   Thyroid function studies No results for input(s): "TSH", "T4TOTAL", "T3FREE", "THYROIDAB" in the last 72 hours.  Invalid input(s): "FREET3" Anemia work up No results for input(s): "VITAMINB12", "FOLATE", "FERRITIN", "TIBC", "IRON", "RETICCTPCT" in the last 72 hours. Urinalysis No results found for:  "COLORURINE", "APPEARANCEUR", "LABSPEC", "PHURINE", "GLUCOSEU", "HGBUR", "BILIRUBINUR", "KETONESUR", "PROTEINUR", "UROBILINOGEN", "NITRITE", "LEUKOCYTESUR" Sepsis Labs Recent Labs  Lab 04/04/22 1126 04/05/22 0406  WBC 8.7 9.8   Microbiology No results found for this or any previous visit (from the past 240 hour(s)). Imaging NM Myocar Multi W/Spect W/Wall Motion / EF  Result Date: 04/05/2022   The study is normal. The study is low risk.   No ST deviation was noted.   LV perfusion is normal. There is no evidence of ischemia. There is no evidence of infarction.   Left ventricular function is normal. Nuclear stress EF: 69 %. The left ventricular ejection fraction is hyperdynamic (>65%). End diastolic cavity size is normal. End systolic cavity size is normal. 1.  Normal left ventricular function 2.  No evidence for scar or ischemia 3.  Low cardiovascular risk   DG Chest 2 View  Result Date: 04/04/2022 CLINICAL DATA:  Chest pain EXAM: CHEST - 2 VIEW COMPARISON:  March 24, 2021, April 07, 2021 FINDINGS: The cardiomediastinal silhouette is unchanged in contour. No acute pleuroparenchymal abnormality. No pleural effusion. No pneumothorax. Visualized abdomen is unremarkable. Multilevel degenerative changes of the thoracic spine. IMPRESSION: No acute cardiopulmonary disease. Electronically Signed   By: Jacob Moores M.D.   On: 04/04/2022 11:41      Time coordinating discharge: Over 30 minutes  SIGNED:  Sunnie Nielsen DO [facility]

## 2022-04-30 NOTE — Consult Note (Signed)
CARDIOLOGY CONSULT NOTE               Patient ID: Nancy Guerrero MRN: [id number] DOB/AGE: Oct 04, 1955 66 y.o.  Admit date: 04/04/2022 Referring Physician Dr. Sherilyn Dacosta hospitalist Primary Physician Dr. Tressia Miners primary Primary Cardiologist Dr. Nehemiah Massed Reason for Consultation chest pain  HPI: Patient is a 66 year old female with past medical history presented with chest pain shortness of the last 2 days has history of hypertension diabetes hyperlipidemia.  Patient was seen in follow-up with cardiology in the past with urgent care with a chest discomfort with advised her to come to the emergency room patient was treated with aspirin Zofran emergency room troponins were slightly elevated with a negative D-dimer patient be admitted for inpatient functional study for evaluation of chest  Review of systems complete and found to be negative unless listed above     Past Medical History:  Diagnosis Date   Diabetes mellitus without complication ([place])     Past Surgical History:  Procedure Laterality Date   ABDOMINAL HYSTERECTOMY     BREAST CYST EXCISION Right    neg    No medications prior to admission.   Social History   Socioeconomic History   Marital status: Married    Spouse name: Not on file   Number of children: Not on file   Years of education: Not on file   Highest education level: Not on file  Occupational History   Not on file  Tobacco Use   Smoking status: Never   Smokeless tobacco: Never  Vaping Use   Vaping Use: Never used  Substance and Sexual Activity   Alcohol use: Never   Drug use: Never   Sexual activity: Not on file  Other Topics Concern   Not on file  Social History Narrative   Not on file   Social Determinants of Health   Financial Resource Strain: Not on file  Food Insecurity: Not on file  Transportation Needs: Not on file  Physical Activity: Not on file  Stress: Not on file  Social Connections: Not on file  Intimate Partner  Violence: Not on file    Family History  Problem Relation Age of Onset   Heart failure Mother    Rheum arthritis Father    Breast cancer Neg Hx       Review of systems complete and found to be negative unless listed above      PHYSICAL EXAM  General: Well developed, well nourished, in no acute distress HEENT:  Normocephalic and atramatic Neck:  No JVD.  Lungs: Clear bilaterally to auscultation and percussion. Heart: HRRR . Normal S1 and S2 without gallops or murmurs.  Abdomen: Bowel sounds are positive, abdomen soft and non-tender  Msk:  Back normal, normal gait. Normal strength and tone for age. Extremities: No clubbing, cyanosis or edema.   Neuro: Alert and oriented X 3. Psych:  Good affect, responds appropriately  Labs:   Lab Results  Component Value Date   WBC 9.8 04/05/2022   HGB 14.6 04/05/2022   HCT 44.5 04/05/2022   MCV 87.4 04/05/2022   PLT 242 04/05/2022   No results for input(s): "NA", "K", "CL", "CO2", "BUN", "CREATININE", "CALCIUM", "PROT", "BILITOT", "ALKPHOS", "ALT", "AST", "GLUCOSE" in the last 168 hours.  Invalid input(s): "LABALBU" No results found for: "CKTOTAL", "CKMB", "CKMBINDEX", "TROPONINI"  Lab Results  Component Value Date   CHOL 219 (H) 04/05/2022   Lab Results  Component Value Date   HDL 36 (L) 04/05/2022   Lab Results  Component Value Date   LDLCALC 138 (H) 04/05/2022   Lab Results  Component Value Date   TRIG 224 (H) 04/05/2022   Lab Results  Component Value Date   CHOLHDL 6.1 04/05/2022   No results found for: "LDLDIRECT"    Radiology: NM Myocar Multi W/Spect W/Wall Motion / EF  Result Date: 04/05/2022   The study is normal. The study is low risk.   No ST deviation was noted.   LV perfusion is normal. There is no evidence of ischemia. There is no evidence of infarction.   Left ventricular function is normal. Nuclear stress EF: 69 %. The left ventricular ejection fraction is hyperdynamic (>65%). End diastolic cavity size  is normal. End systolic cavity size is normal. 1.  Normal left ventricular function 2.  No evidence for scar or ischemia 3.  Low cardiovascular risk   DG Chest 2 View  Result Date: 04/04/2022 CLINICAL DATA:  Chest pain EXAM: CHEST - 2 VIEW COMPARISON:  March 24, 2021, April 07, 2021 FINDINGS: The cardiomediastinal silhouette is unchanged in contour. No acute pleuroparenchymal abnormality. No pleural effusion. No pneumothorax. Visualized abdomen is unremarkable. Multilevel degenerative changes of the thoracic spine. IMPRESSION: No acute cardiopulmonary disease. Electronically Signed   By: Jacob Moores M.D.   On: 04/04/2022 11:41    EKG: EKG independently reviewed by me is normal sinus with ST-T wave changes rate of about 70 with PACs  ASSESSMENT AND PLAN:  Atypical chest pain Elevated troponin Hypertension Hyperlipidemia Diabetes Demand ischemia . Plan Agree with admission continue aspirin patient refuses heparin for anticoagulation Continue to follow troponins probably demand ischemia we will continue to follow Possible study for further assessment evaluation Agree with blood pressure management and control amlodipine metoprolol. Agree with diabetes management Continue statin therapy for hyperlipidemia Recommend conservative medical therapy   Signed: Alwyn Pea MD 04/30/2022, 9:15 PM

## 2022-05-04 IMAGING — CR DG CHEST 2V
2 series · 2 of 2 positions shown · non-contrast
Comparison: None.

CLINICAL DATA: Chest pain

EXAM:
CHEST - 2 VIEW

[chest lat]
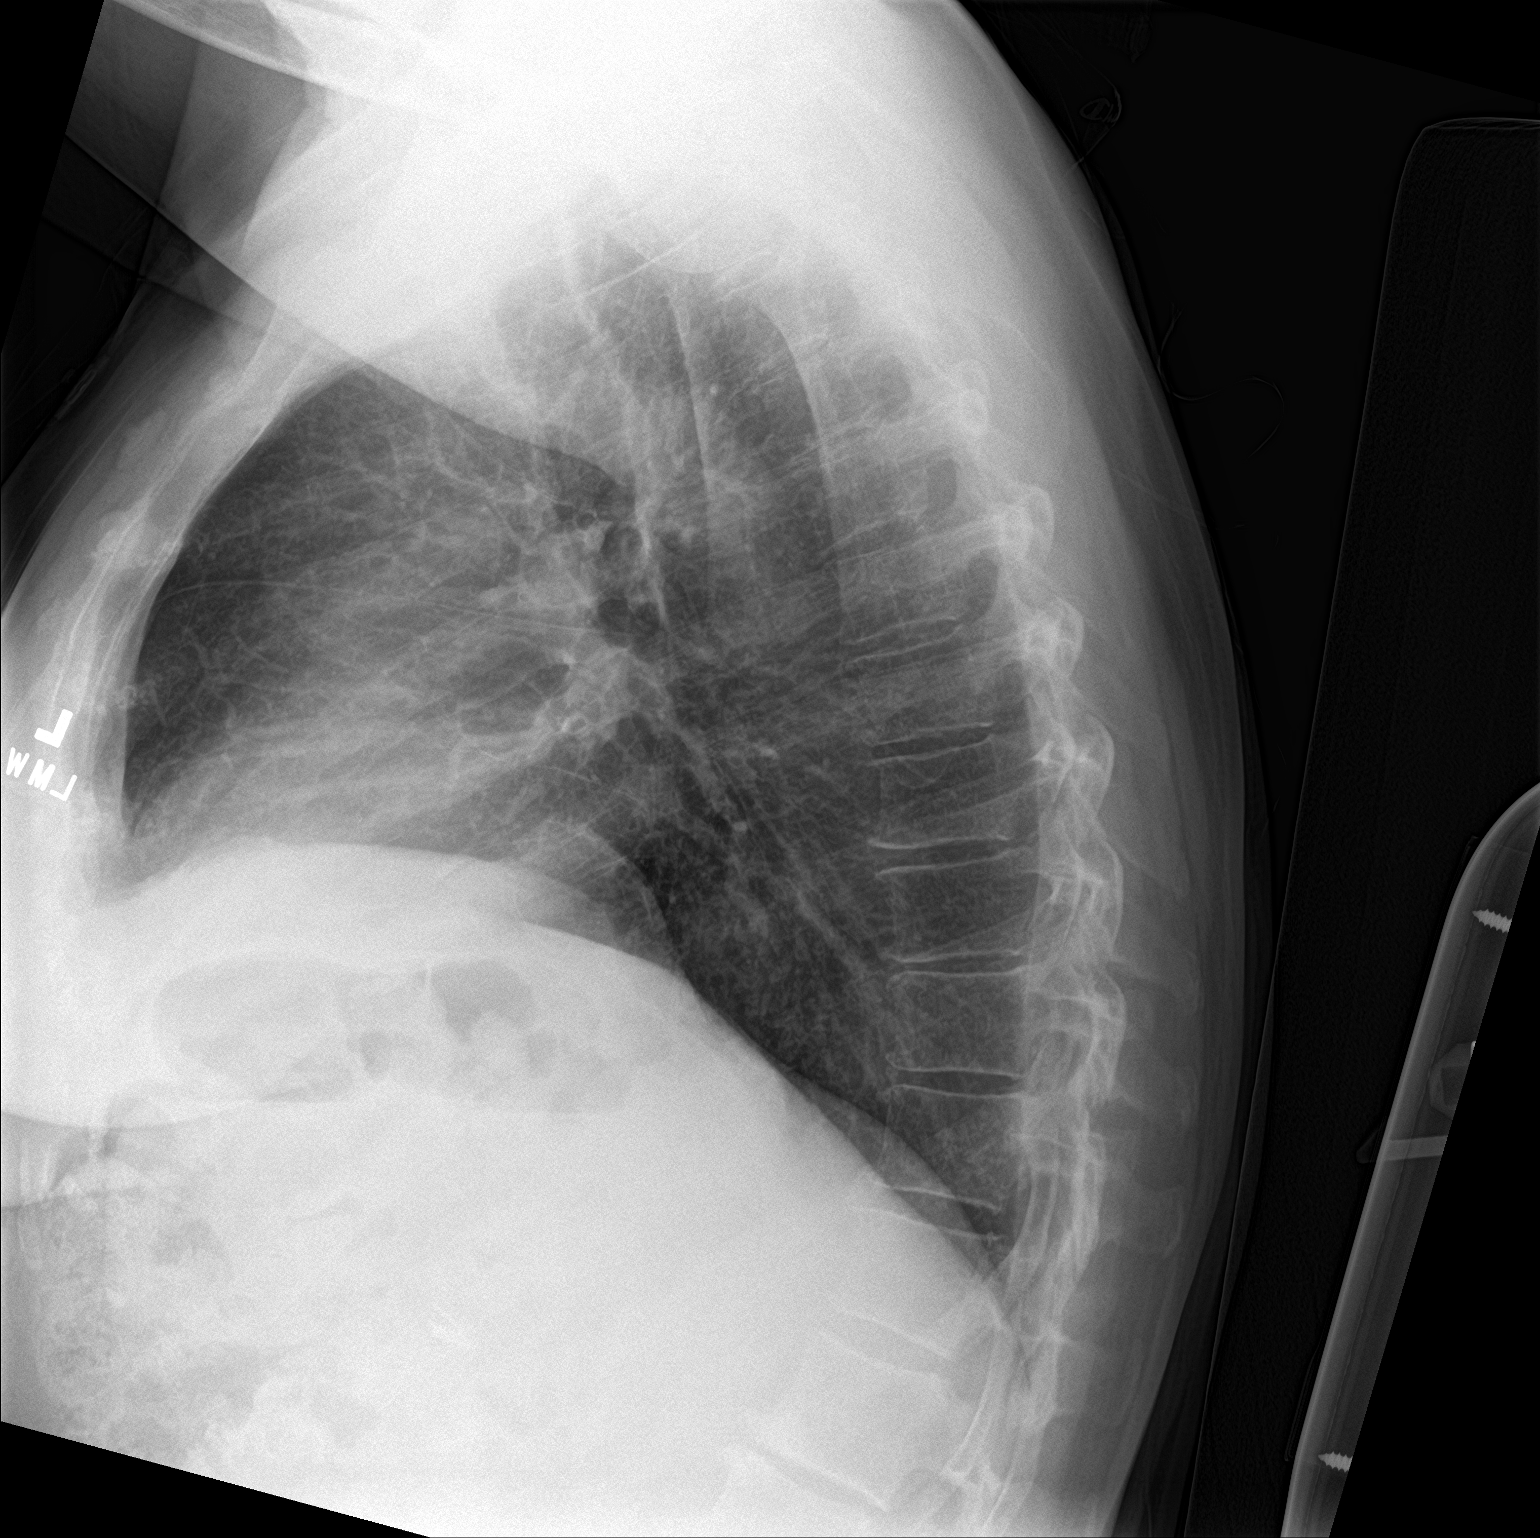

[chest ap]
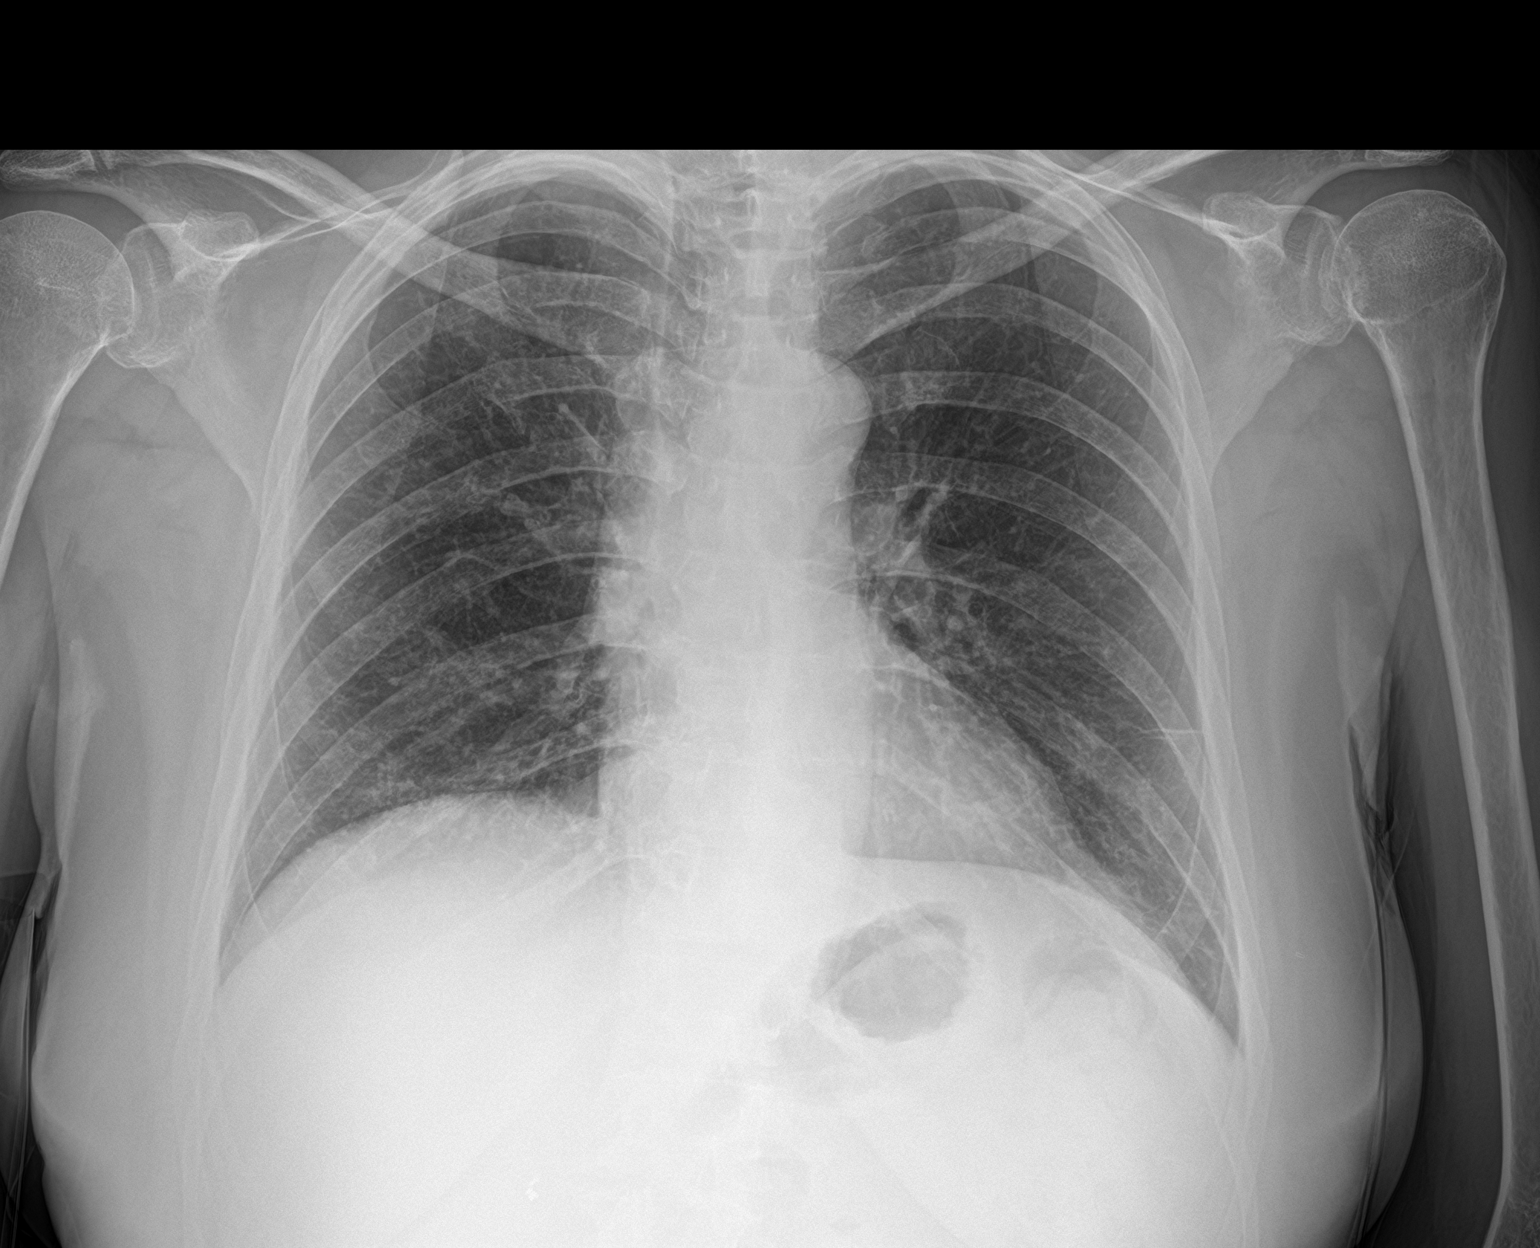

[2 of 2 positions shown; findings below may reference images not displayed]

FINDINGS: Scarring in the lingula. Right lung clear. Heart is normal size. No
effusions or acute bony abnormality.
IMPRESSION: No active cardiopulmonary disease.

## 2022-05-18 IMAGING — CT CT HEART MORP W/ CTA COR W/ SCORE W/ CA W/CM &/OR W/O CM
4 of 7 series · 8 of 20 positions shown, 9 images · IV contrast (APPLIED)
Comparison: None.
COMPARISON: None.

Addendum:
EXAM:
OVER-READ INTERPRETATION  CT CHEST

The following report is an over-read performed by radiologist Dr.
Gazsy Rehus [[email or web address]] on 04/07/2021. This over-read
does not include interpretation of cardiac or coronary anatomy or
pathology. The coronary CTA interpretation by the cardiologist is
attached.
HISTORY: Chest pain
Cardiac/Coronary CT
TECHNIQUE: The patient was scanned on a Siemens Force scanner.
PROTOCOL: A 120 kV prospective scan was triggered in the descending thoracic
aorta at 111 HU's. Axial non-contrast 3 mm slices were carried out
through the heart. The data set was analyzed on a dedicated work
station and scored using the Agatson method. Gantry rotation speed
was 250 msecs and collimation was 0.6 mm. Heart rate optimized
medically, and 0.8 mg of sublingual nitroglycerin was given. The 3D
data set was reconstructed in 5% intervals of 35-75% of the R-R
cycle. Diastolic phases were analyzed on a dedicated work station
using MPR, MIP and VRT modes. The patient received 95mL OMNIPAQUE
IOHEXOL 350 MG/ML SOLN of contrast.

[Series 6: ts diast sharp · axial · 0.39mm/px · z∈[+1283,+1317]mm · 2 of 253 slices shown]
[im 85/253  lung]
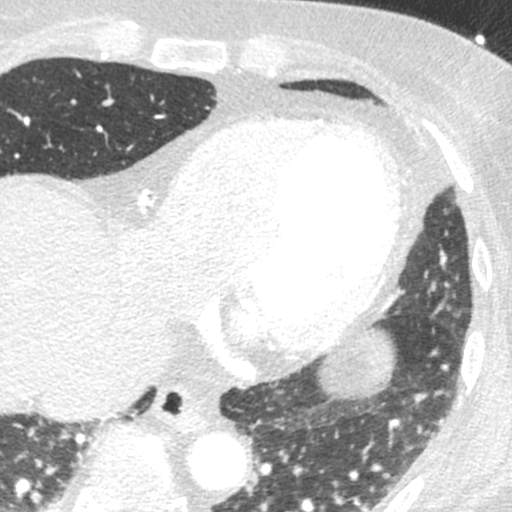
[im 169/253  lung]
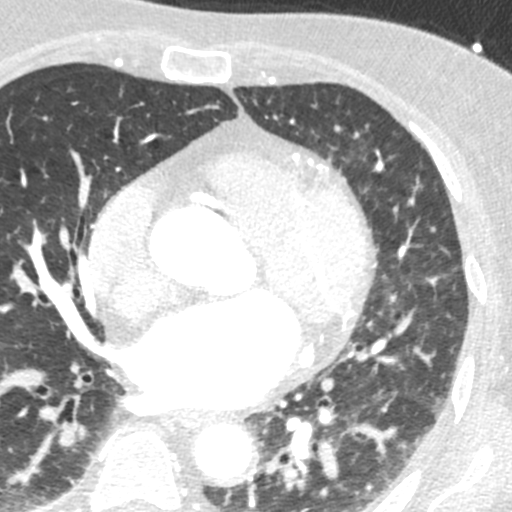

[Series 7: best diast · axial · 0.39mm/px · z∈[+1283,+1317]mm · 2 of 253 slices shown, 3 images]
[im 85/253  vessel]
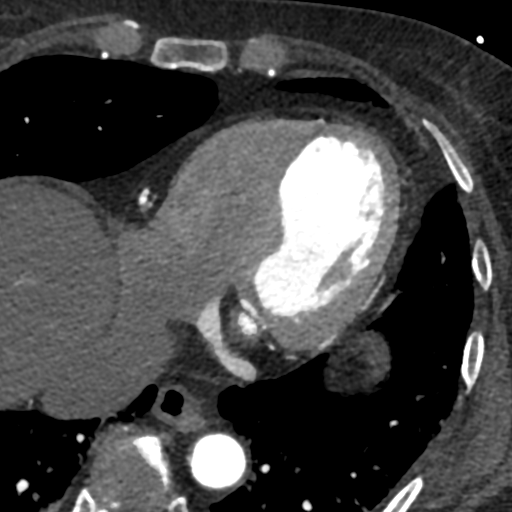
[im 85/253  lung]
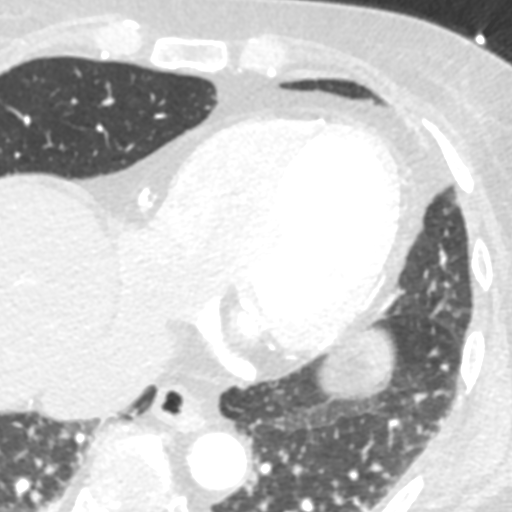
[im 169/253  vessel]
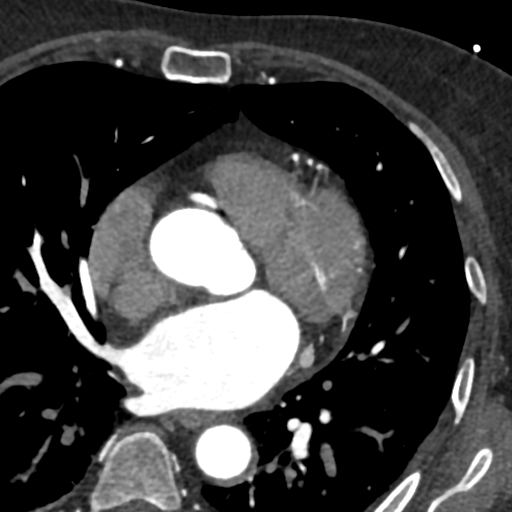

[Series 8: best syst · axial · 0.39mm/px · z∈[+1283,+1317]mm · 2 of 253 slices shown]
[im 85/253  vessel]
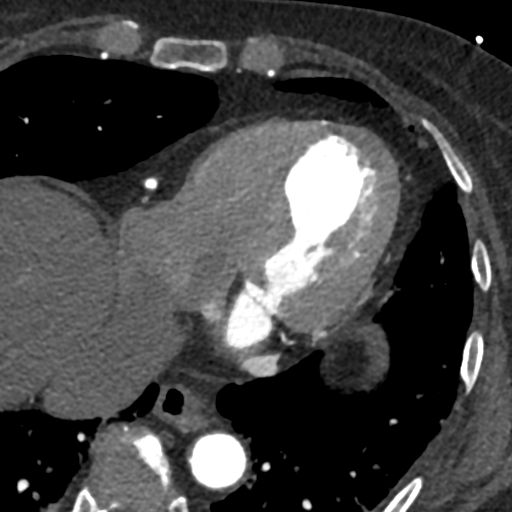
[im 169/253  vessel]
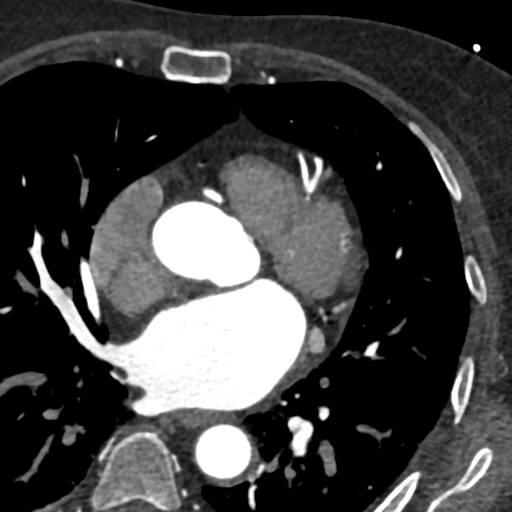

[Series 9: ts syst sharp · axial · 0.39mm/px · z∈[+1283,+1317]mm · 2 of 253 slices shown]
[im 85/253  lung]
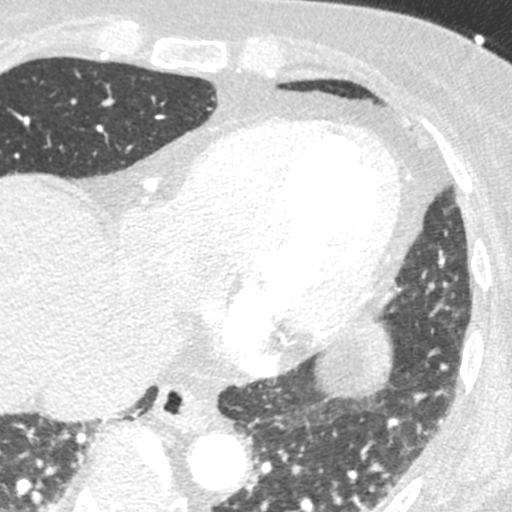
[im 169/253  lung]
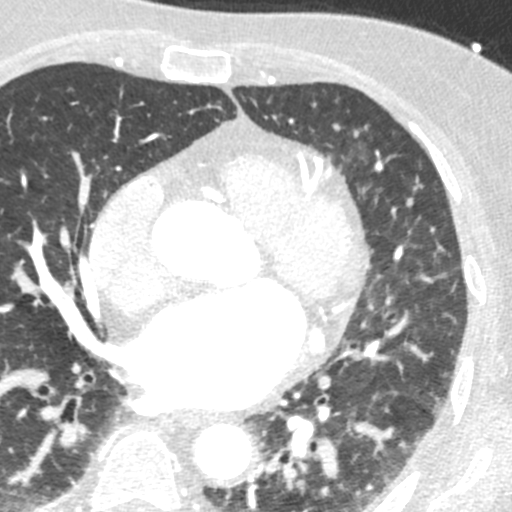

[8 of 20 positions shown; findings below may reference images not displayed]

FINDINGS: Vascular: Heart is normal size. Aorta normal caliber. Scattered
calcifications in the descending thoracic aorta

Mediastinum/Nodes: No adenopathy

Lungs/Pleura: No confluent opacities or effusions.

Upper Abdomen: Imaging into the upper abdomen demonstrates no acute
findings.

Musculoskeletal: Chest wall soft tissues are unremarkable. No acute
bony abnormality.
IMPRESSION: No acute extra cardiac abnormality.

Scattered descending aortic atherosclerosis.
FINDINGS: Coronary calcium score: The patient's coronary artery calcium score
is 18, which places the patient in the 59 percentile.

Coronary arteries: Normal coronary origins.  Right dominance.

Right Coronary Artery: Large caliber vessel, gives rise to PDA and
PLB. There is a focal calcified plaque in the mid RCA with 1-24%
stenosis.

Left Main Coronary Artery: Normal caliber vessel. No significant
plaque or stenosis.

Left Anterior Descending Coronary Artery: Normal caliber vessel. No
significant plaque or stenosis. Gives rise to diagonal branches.

Left Circumflex Artery: Normal caliber vessel at ostium but tapers
over a short course to small caliber. Nondominant. No significant
plaque or stenosis. Gives rise to OM branches.

Aorta: Normal size, 34 mm at the mid ascending aorta (level of the
PA bifurcation) measured double oblique. No calcifications. No
dissection seen in visualized portions of the aorta.

Aortic Valve: No calcifications. Trileaflet.

Other findings:

Normal pulmonary vein drainage into the left atrium.

Normal left atrial appendage without a thrombus.

Normal size of the pulmonary artery.

Normal appearance of the pericardium.
IMPRESSION: 1.  Minimal nonobstructive CAD, CADRADS = 1.

2. Coronary calcium score of 18. This was 59th percentile for age
and sex matched control.

3. Normal coronary origin with right dominance.

INTERPRETATION:

1. CAD-RADS 0: No evidence of CAD (0%). Consider non-atherosclerotic
causes of chest pain.

2. CAD-RADS 1: Minimal non-obstructive CAD (0-24%). Consider
non-atherosclerotic causes of chest pain. Consider preventive
therapy and risk factor modification.

3. CAD-RADS 2: Mild non-obstructive CAD (25-49%). Consider
non-atherosclerotic causes of chest pain. Consider preventive
therapy and risk factor modification.

4. CAD-RADS 3: Moderate stenosis (50-69%). Consider symptom-guided
anti-ischemic pharmacotherapy as well as risk factor modification
per guideline directed care. Additional analysis with CT FFR will be
submitted.

5. CAD-RADS 4: Severe stenosis. (70-99% or > 50% left main). Cardiac
catheterization or CT FFR is recommended. Consider symptom-guided
anti-ischemic pharmacotherapy as well as risk factor modification
per guideline directed care. Invasive coronary angiography
recommended with revascularization per published guideline
statements.

6. CAD-RADS 5: Total coronary occlusion (100%). Consider cardiac
catheterization or viability assessment. Consider symptom-guided
anti-ischemic pharmacotherapy as well as risk factor modification
per guideline directed care.

7. CAD-RADS N: Non-diagnostic study. Obstructive CAD can't be
excluded. Alternative evaluation is recommended.

*** End of Addendum ***
EXAM:
OVER-READ INTERPRETATION  CT CHEST

The following report is an over-read performed by radiologist Dr.
Gazsy Rehus [[email or web address]] on 04/07/2021. This over-read
does not include interpretation of cardiac or coronary anatomy or
pathology. The coronary CTA interpretation by the cardiologist is
attached.
FINDINGS: Vascular: Heart is normal size. Aorta normal caliber. Scattered
calcifications in the descending thoracic aorta

Mediastinum/Nodes: No adenopathy

Lungs/Pleura: No confluent opacities or effusions.

Upper Abdomen: Imaging into the upper abdomen demonstrates no acute
findings.

Musculoskeletal: Chest wall soft tissues are unremarkable. No acute
bony abnormality.
IMPRESSION: No acute extra cardiac abnormality.

Scattered descending aortic atherosclerosis.

## 2022-11-30 ENCOUNTER — Other Ambulatory Visit: Payer: Self-pay | Admitting: Internal Medicine

## 2022-11-30 DIAGNOSIS — Z1231 Encounter for screening mammogram for malignant neoplasm of breast: Secondary | ICD-10-CM

## 2022-11-30 DIAGNOSIS — M7989 Other specified soft tissue disorders: Secondary | ICD-10-CM

## 2022-12-06 ENCOUNTER — Ambulatory Visit
Admission: RE | Admit: 2022-12-06 | Discharge: 2022-12-06 | Disposition: A | Discharge status: Home / Self Care | Payer: Medicare PPO | Source: Ambulatory Visit | Attending: Internal Medicine | Admitting: Internal Medicine

## 2022-12-06 DIAGNOSIS — M7989 Other specified soft tissue disorders: Secondary | ICD-10-CM | POA: Insufficient documentation

## 2022-12-13 ENCOUNTER — Ambulatory Visit: Payer: Medicare Other

## 2023-11-23 LAB — EXTERNAL GENERIC LAB PROCEDURE: COLOGUARD: NEGATIVE
